# Patient Record
Sex: Male | Born: 1955 | Race: White | Hispanic: No | Marital: Single | Smoking: Never smoker
Health system: Southern US, Community
[De-identification: ages and names within clinical notes are randomized; demographics above are authoritative.]

## PROBLEM LIST (undated history)

## (undated) DIAGNOSIS — Z8619 Personal history of other infectious and parasitic diseases: Secondary | ICD-10-CM

## (undated) DIAGNOSIS — Z973 Presence of spectacles and contact lenses: Secondary | ICD-10-CM

## (undated) DIAGNOSIS — F32A Depression, unspecified: Secondary | ICD-10-CM

## (undated) HISTORY — PX: RHINOPLASTY: SUR1284

## (undated) HISTORY — DX: Depression, unspecified: F32.A

## (undated) HISTORY — DX: Personal history of other infectious and parasitic diseases: Z86.19

---

## 2007-10-30 DIAGNOSIS — F329 Major depressive disorder, single episode, unspecified: Secondary | ICD-10-CM | POA: Insufficient documentation

## 2007-10-30 DIAGNOSIS — F32A Depression, unspecified: Secondary | ICD-10-CM | POA: Insufficient documentation

## 2008-05-28 DIAGNOSIS — R5381 Other malaise: Secondary | ICD-10-CM | POA: Insufficient documentation

## 2015-10-26 ENCOUNTER — Ambulatory Visit (INDEPENDENT_AMBULATORY_CARE_PROVIDER_SITE_OTHER): Payer: No Typology Code available for payment source | Admitting: Family Medicine

## 2015-10-26 ENCOUNTER — Encounter: Payer: Self-pay | Admitting: Family Medicine

## 2015-10-26 VITALS — BP 98/64 | HR 85 | Temp 98.3°F | Resp 16 | Ht 71.5 in | Wt 195.0 lb

## 2015-10-26 DIAGNOSIS — K921 Melena: Secondary | ICD-10-CM | POA: Insufficient documentation

## 2015-10-26 DIAGNOSIS — G479 Sleep disorder, unspecified: Secondary | ICD-10-CM | POA: Insufficient documentation

## 2015-10-26 DIAGNOSIS — F419 Anxiety disorder, unspecified: Secondary | ICD-10-CM

## 2015-10-26 DIAGNOSIS — Z23 Encounter for immunization: Secondary | ICD-10-CM

## 2015-10-26 DIAGNOSIS — Z0000 Encounter for general adult medical examination without abnormal findings: Secondary | ICD-10-CM

## 2015-10-26 DIAGNOSIS — S161XXA Strain of muscle, fascia and tendon at neck level, initial encounter: Secondary | ICD-10-CM | POA: Insufficient documentation

## 2015-10-26 DIAGNOSIS — Z566 Other physical and mental strain related to work: Secondary | ICD-10-CM | POA: Insufficient documentation

## 2015-10-26 MED ORDER — ALPRAZOLAM 0.5 MG PO TABS: 0.5000 mg | ORAL_TABLET | Freq: Every day | ORAL | Status: DC | PRN

## 2015-10-26 MED ORDER — SERTRALINE HCL 100 MG PO TABS: 100.0000 mg | ORAL_TABLET | Freq: Every day | ORAL | Status: DC

## 2015-10-26 NOTE — Progress Notes (Signed)
Patient: Eddie Cabrera, Male    DOB: 01-05-1956, 59 y.o.   MRN: [id number] Visit Date: 10/26/2015  Today's Provider: [name], MD   Chief Complaint  Patient presents with  . Annual Exam  . Anxiety  . Depression   Subjective:    Annual physical exam Eddie Cabrera is a 59 y.o. male who presents today for health maintenance and complete physical. He feels well. He reports exercising yes. He reports he is sleeping fairly well.  -----------------------------------------------------------------   Follow-up for anxiety from 11/26/14; restarted alprazolam prn. States he is doing really well on sertraline and wants to continue same regimen.     Review of Systems  Constitutional: Negative.   HENT: Negative.   Eyes: Negative.   Respiratory: Negative.   Cardiovascular: Negative.   Gastrointestinal: Positive for diarrhea. Negative for blood in stool.       X2 years  Endocrine: Negative.   Genitourinary: Positive for decreased urine volume.  Musculoskeletal: Positive for myalgias, back pain, arthralgias, neck pain and neck stiffness.  Skin: Negative.   Allergic/Immunologic: Negative.   Neurological: Negative.   Hematological: Negative.   Psychiatric/Behavioral: Negative.     Social History He  reports that he has never smoked. He does not have any smokeless tobacco history on file. He reports that he drinks alcohol. He reports that he does not use illicit drugs. Social History   Social History  . Marital Status: Single    Spouse Name: N/A  . Number of Children: 1  . Years of Education: N/A   Social History Main Topics  . Smoking status: Never Smoker   . Smokeless tobacco: None  . Alcohol Use: 0.0 oz/week    0 Standard drinks or equivalent per week     Comment: occasional  . Drug Use: No  . Sexual Activity: Not Asked   Other Topics Concern  . None   Social History Narrative    Patient Active Problem List   Diagnosis Date Noted  . Anxiety 10/26/2015    . Blood in feces 10/26/2015  . Cervical muscle strain 10/26/2015  . Sleep disturbance 10/26/2015  . Stress at work 10/26/2015  . Depressive disorder 10/30/2007  . Episodic mood disorder ([place]) 12/26/1998    Past Surgical History  Procedure Laterality Date  . Rhinoplasty      AGE 55 and again at age 23- history of deviated Septum    Family History  Family Status  Relation Status Death Age  . Mother Deceased     died in her 79's  . Father Deceased 15    kidney failure  . Sister Alive    His family history includes Bladder Cancer in his mother; Kidney failure in his father.    No Known Allergies  Previous Medications   ALPRAZOLAM (XANAX) 0.5 MG TABLET    Take 1-2 tablets by mouth daily as needed.   SERTRALINE (ZOLOFT) 100 MG TABLET    Take 1-2 tablets by mouth daily.    Patient Care Team: Birdie Sons, MD as PCP - General (Family Medicine)     Objective:   Vitals: BP 98/64 mmHg  Pulse 85  Temp(Src) 98.3 F (36.8 C) (Oral)  Resp 16  Ht 5' 11.5" (1.816 m)  Wt 195 lb (88.451 kg)  BMI 26.82 kg/m2  SpO2 97%   Physical Exam   General Appearance:    Alert, cooperative, no distress, appears stated age  Head:    Normocephalic, without obvious  abnormality, atraumatic  Eyes:    PERRL, conjunctiva/corneas clear, EOM's intact, fundi    benign, both eyes       Ears:    Normal TM's and external ear canals, both ears  Nose:   Nares normal, septum midline, mucosa normal, no drainage   or sinus tenderness  Throat:   Lips, mucosa, and tongue normal; teeth and gums normal  Neck:   Supple, symmetrical, trachea midline, no adenopathy;       thyroid:  No enlargement/tenderness/nodules; no carotid   bruit or JVD  Back:     Symmetric, no curvature, ROM normal, no CVA tenderness  Lungs:     Clear to auscultation bilaterally, respirations unlabored  Chest wall:    No tenderness or deformity  Heart:    Regular rate and rhythm, S1 and S2 normal, no murmur, rub   or gallop   Abdomen:     Soft, non-tender, bowel sounds active all four quadrants,    no masses, no organomegaly  Genitalia:    deferred  Rectal:    deferred  Extremities:   Extremities normal, atraumatic, no cyanosis or edema  Pulses:   2+ and symmetric all extremities  Skin:   Skin color, texture, turgor normal, no rashes or lesions  Lymph nodes:   Cervical, supraclavicular, and axillary nodes normal  Neurologic:   CNII-XII intact. Normal strength, sensation and reflexes      throughout     Depression Screen No flowsheet data found.    Assessment & Plan:     Routine Health Maintenance and Physical Exam  Exercise Activities and Dietary recommendations Goals    None      Immunization History  Administered Date(s) Administered  . Tdap 05/28/2008    Health Maintenance  Topic Date Due  . Hepatitis C Screening  10-31-1956  . HIV Screening  12/19/1973  . TETANUS/TDAP  12/19/1977  . COLONOSCOPY  12/19/2008  . INFLUENZA VACCINE  07/27/2015      Discussed health benefits of physical activity, and encouraged him to engage in regular exercise appropriate for his age and condition.    --------------------------------------------------------------------  1. Annual physical exam  - Comprehensive metabolic panel - Lipid panel - PSA - Ambulatory referral to Gastroenterology  2. Anxiety Doing very well on current medications.  - ALPRAZolam (XANAX) 0.5 MG tablet; Take 1-2 tablets (0.5-1 mg total) by mouth daily as needed.  Dispense: 30 tablet; Refill: 1 - sertraline (ZOLOFT) 100 MG tablet; Take 1-2 tablets (100-200 mg total) by mouth daily.  Dispense: 30 tablet; Refill: 12  3. Need for influenza vaccination  - Flu Vaccine QUAD 36+ mos IM

## 2015-10-27 ENCOUNTER — Encounter: Payer: Self-pay | Admitting: Family Medicine

## 2015-10-30 ENCOUNTER — Other Ambulatory Visit: Payer: Self-pay

## 2015-10-30 ENCOUNTER — Telehealth: Payer: Self-pay | Admitting: Gastroenterology

## 2015-10-30 NOTE — Telephone Encounter (Signed)
Gastroenterology Pre-Procedure Review  Request Date: 11-09-2015 Requesting Physician: Dr.   PATIENT REVIEW QUESTIONS: The patient responded to the following health history questions as indicated:    1. Are you having any GI issues? no 2. Do you have a personal history of Polyps? no 3. Do you have a family history of Colon Cancer or Polyps? no 4. Diabetes Mellitus? no 5. Joint replacements in the past 12 months?no 6. Major health problems in the past 3 months?no 7. Any artificial heart valves, MVP, or defibrillator?no    MEDICATIONS & ALLERGIES:    Patient reports the following regarding taking any anticoagulation/antiplatelet therapy:   Plavix, Coumadin, Eliquis, Xarelto, Lovenox, Pradaxa, Brilinta, or Effient? no Aspirin? no  Patient confirms/reports the following medications:  Current Outpatient Prescriptions  Medication Sig Dispense Refill   ALPRAZolam (XANAX) 0.5 MG tablet Take 1-2 tablets (0.5-1 mg total) by mouth daily as needed. 30 tablet 1   sertraline (ZOLOFT) 100 MG tablet Take 1-2 tablets (100-200 mg total) by mouth daily. 30 tablet 12   No current facility-administered medications for this visit.    Patient confirms/reports the following allergies:  No Known Allergies  No orders of the defined types were placed in this encounter.    AUTHORIZATION INFORMATION Primary Insurance: 1D#: Group #:  Secondary Insurance: 1D#: Group #:  SCHEDULE INFORMATION: Date: 11-09-2015 Time: Location:[facility]

## 2015-11-04 ENCOUNTER — Encounter: Payer: Self-pay | Admitting: *Deleted

## 2015-11-04 ENCOUNTER — Encounter: Payer: Self-pay | Admitting: Anesthesiology

## 2015-11-04 ENCOUNTER — Telehealth: Payer: Self-pay | Admitting: Gastroenterology

## 2015-11-04 NOTE — Telephone Encounter (Signed)
Patient has a colonoscopy scheduled but doesn't have a ride. May need to cancel OR reschedule. Please call.

## 2015-11-04 NOTE — Telephone Encounter (Signed)
Pt had to cancel for now due to not having anyone to bring him.

## 2015-11-05 NOTE — Discharge Instructions (Signed)

## 2015-11-09 ENCOUNTER — Ambulatory Visit
Admission: RE | Admit: 2015-11-09 | Payer: No Typology Code available for payment source | Source: Ambulatory Visit | Admitting: Gastroenterology

## 2015-11-09 HISTORY — DX: Presence of spectacles and contact lenses: Z97.3

## 2015-11-09 SURGERY — COLONOSCOPY WITH PROPOFOL
Anesthesia: Choice

## 2015-11-13 ENCOUNTER — Other Ambulatory Visit: Payer: Self-pay | Admitting: Family Medicine

## 2016-02-22 ENCOUNTER — Other Ambulatory Visit: Payer: Self-pay | Admitting: Family Medicine

## 2016-02-22 ENCOUNTER — Other Ambulatory Visit: Payer: Self-pay | Admitting: *Deleted

## 2016-02-22 MED ORDER — SERTRALINE HCL 100 MG PO TABS: 100.0000 mg | ORAL_TABLET | Freq: Every day | ORAL | Status: DC

## 2016-02-22 NOTE — Telephone Encounter (Signed)
Requesting 90 day supply.

## 2016-09-14 ENCOUNTER — Encounter: Payer: Self-pay | Admitting: Family Medicine

## 2016-09-14 ENCOUNTER — Ambulatory Visit (INDEPENDENT_AMBULATORY_CARE_PROVIDER_SITE_OTHER): Payer: BLUE CROSS/BLUE SHIELD | Admitting: Family Medicine

## 2016-09-14 VITALS — BP 124/88 | HR 68 | Temp 98.0°F | Resp 16 | Wt 197.0 lb

## 2016-09-14 DIAGNOSIS — Z23 Encounter for immunization: Secondary | ICD-10-CM

## 2016-09-14 DIAGNOSIS — M25541 Pain in joints of right hand: Secondary | ICD-10-CM

## 2016-09-14 NOTE — Progress Notes (Signed)
       Patient: Eddie Cabrera Male    DOB: August 20, 1956   60 y.o.   MRN: [id number] Visit Date: 09/14/2016  Today's Provider: [name], MD   Chief Complaint  Patient presents with  . Hand Pain   Subjective:    Hand Pain   Incident onset: 1 week ago. There was no injury mechanism. Pain location: joints of fingers in both hands. The quality of the pain is described as aching. The pain has been constant since the incident. Pertinent negatives include no chest pain, muscle weakness, numbness or tingling. He has tried NSAIDs for the symptoms. The treatment provided mild relief.  No recent injuries and no new physical activities. Tried ibuprofen this morning which helped a little bit. No fevers chills, or sweats. No locking of knuckles. He states his father had bad arthritis and he is concerned about RA.      No Known Allergies   Current Outpatient Prescriptions:  .  ALPRAZolam (XANAX) 0.5 MG tablet, Take 1-2 tablets (0.5-1 mg total) by mouth daily as needed., Disp: 30 tablet, Rfl: 1 .  Multiple Vitamin (MULTIVITAMIN) tablet, Take 1 tablet by mouth daily., Disp: , Rfl:  .  sertraline (ZOLOFT) 100 MG tablet, Take 1-2 tablets (100-200 mg total) by mouth daily., Disp: 180 tablet, Rfl: 1  Review of Systems  Constitutional: Positive for fatigue. Negative for appetite change, chills and fever.  Respiratory: Negative for chest tightness, shortness of breath and wheezing.   Cardiovascular: Negative for chest pain and palpitations.  Gastrointestinal: Negative for abdominal pain, nausea and vomiting.  Musculoskeletal: Positive for arthralgias (joints in fingers).  Neurological: Negative for tingling and numbness.    Social History  Substance Use Topics  . Smoking status: Never Smoker  . Smokeless tobacco: Never Used  . Alcohol use 4.2 oz/week    7 Cans of beer per week     Comment: moderate use   Objective:   BP 124/88 (BP Location: Left Arm, Patient Position: Sitting, Cuff Size:  Large)   Pulse 68   Temp 98 F (36.7 C) (Oral)   Resp 16   Wt 197 lb (89.4 kg)   BMI 27.09 kg/m   Physical Exam  General appearance: alert, well developed, well nourished, cooperative and in no distress Head: Normocephalic, without obvious abnormality, atraumatic Respiratory: Respirations even and unlabored, normal respiratory rate Extremities: Slight tenderness of IPs of third fourth fingers of both hands. Slightly swollen, no erythema.      Assessment & Plan:     1. Arthralgia of hand, right  - DG Hand Complete Right; Future - ANA w/Reflex if Positive - Rheumatoid factor - Sedimentation rate - CYCLIC CITRUL PEPTIDE ANTIBODY, IGG/IGA - Uric acid  2. Need for influenza vaccination  - Flu Vaccine QUAD 36+ mos PF IM (Fluarix & Fluzone Quad PF)     The entirety of the information documented in the History of Present Illness, Review of Systems and Physical Exam were personally obtained by me. Portions of this information were initially documented by Meyer Cory, CMA and reviewed by me for thoroughness and accuracy.    [name], MD  [facility]

## 2016-09-14 NOTE — Patient Instructions (Signed)
Go to the [facility] on [address] for hand [employer]

## 2016-10-03 ENCOUNTER — Other Ambulatory Visit: Payer: Self-pay | Admitting: Family Medicine

## 2016-10-21 ENCOUNTER — Ambulatory Visit
Admission: RE | Admit: 2016-10-21 | Discharge: 2016-10-21 | Disposition: A | Discharge status: Home / Self Care | Payer: BLUE CROSS/BLUE SHIELD | Source: Ambulatory Visit | Attending: Family Medicine | Admitting: Family Medicine

## 2016-10-21 DIAGNOSIS — M25541 Pain in joints of right hand: Secondary | ICD-10-CM | POA: Diagnosis not present

## 2016-10-21 DIAGNOSIS — M79641 Pain in right hand: Secondary | ICD-10-CM | POA: Diagnosis not present

## 2016-10-23 LAB — ANA W/REFLEX IF POSITIVE
Anti Nuclear Antibody(ANA): POSITIVE — AB
DSDNA AB: 1 [IU]/mL (ref 0–9)
ENA RNP Ab: 1.4 AI — ABNORMAL HIGH (ref 0.0–0.9)
ENA SM Ab Ser-aCnc: <0.2 AI (ref 0.0–0.9)
ENA SSA (RO) Ab: <0.2 AI (ref 0.0–0.9)
ENA SSB (LA) Ab: <0.2 AI (ref 0.0–0.9)

## 2016-10-23 LAB — SEDIMENTATION RATE: SED RATE: 2 mm/h (ref 0–30)

## 2016-10-23 LAB — RHEUMATOID FACTOR

## 2016-10-23 LAB — URIC ACID: URIC ACID: 5.4 mg/dL (ref 3.7–8.6)

## 2016-10-23 LAB — CYCLIC CITRUL PEPTIDE ANTIBODY, IGG/IGA: CYCLIC CITRULLIN PEPTIDE AB: 17 U (ref 0–19)

## 2016-10-27 ENCOUNTER — Ambulatory Visit (INDEPENDENT_AMBULATORY_CARE_PROVIDER_SITE_OTHER): Payer: BLUE CROSS/BLUE SHIELD | Admitting: Family Medicine

## 2016-10-27 ENCOUNTER — Encounter: Payer: Self-pay | Admitting: Family Medicine

## 2016-10-27 VITALS — BP 130/88 | HR 76 | Temp 97.9°F | Resp 16 | Ht 71.5 in | Wt 196.0 lb

## 2016-10-27 DIAGNOSIS — F419 Anxiety disorder, unspecified: Secondary | ICD-10-CM | POA: Diagnosis not present

## 2016-10-27 DIAGNOSIS — Z125 Encounter for screening for malignant neoplasm of prostate: Secondary | ICD-10-CM | POA: Diagnosis not present

## 2016-10-27 DIAGNOSIS — Z0000 Encounter for general adult medical examination without abnormal findings: Secondary | ICD-10-CM

## 2016-10-27 MED ORDER — ALPRAZOLAM 0.5 MG PO TABS: 0.5000 mg | ORAL_TABLET | Freq: Every day | ORAL | 1 refills | Status: DC | PRN

## 2016-10-27 NOTE — Progress Notes (Signed)
Patient: Eddie Cabrera, Male    DOB: [birth year], 58 y.o.   MRN: [id number] Visit Date: 10/27/2016  Today's Provider: [name], MD   Chief Complaint  Patient presents with  . Annual Exam  . Depression  . Anxiety   Subjective:    Annual physical exam Eddie Cabrera is a 60 y.o. male who presents today for health maintenance and complete physical. He feels well. He reports exercising yes/some. He reports he is sleeping well.  ----------------------------------------------------------------   He reports that sertraline has been very helpful to his mood. Does not perseverate or get anxious about minor events and stresses in life. Mood is generally very good. Usually sleeps well. No adverse effects. Takes alprazolam occasionally only to help fall asleep.    Review of Systems  Constitutional: Negative.   HENT: Positive for postnasal drip.   Eyes: Negative.   Respiratory: Positive for apnea.   Cardiovascular: Negative.   Gastrointestinal: Negative.   Endocrine: Negative.   Genitourinary: Negative.   Musculoskeletal: Positive for neck stiffness.  Skin: Negative.   Allergic/Immunologic: Negative.   Neurological: Negative.   Hematological: Negative.   Psychiatric/Behavioral: Negative.     Social History      He  reports that he has never smoked. He has never used smokeless tobacco. He reports that he drinks about 4.2 oz of alcohol per week . He reports that he does not use drugs.       Social History   Social History  . Marital status: Single    Spouse name: N/A  . Number of children: 1  . Years of education: N/A   Social History Main Topics  . Smoking status: Never Smoker  . Smokeless tobacco: Never Used  . Alcohol use 4.2 oz/week    7 Cans of beer per week     Comment: moderate use  . Drug use: No  . Sexual activity: Not Asked   Other Topics Concern  . None   Social History Narrative  . None    Past Medical History:  Diagnosis Date  . History  of chicken pox   . Wears contact lenses      Patient Active Problem List   Diagnosis Date Noted  . Anxiety 10/26/2015  . Blood in feces 10/26/2015  . Cervical muscle strain 10/26/2015  . Sleep disturbance 10/26/2015  . Stress at work 10/26/2015  . Depressive disorder 10/30/2007  . Episodic mood disorder ([place]) 12/26/1998    Past Surgical History:  Procedure Laterality Date  . RHINOPLASTY     AGE 28 and again at age 38- history of deviated Septum    Family History        Family Status  Relation Status  . Mother Deceased   died in her 60's  . Father Deceased at age 33   kidney failure  . Sister Alive        His family history includes Bladder Cancer in his mother; Kidney failure in his father.    No Known Allergies  Current Meds  Medication Sig  . ALPRAZolam (XANAX) 0.5 MG tablet Take 1-2 tablets (0.5-1 mg total) by mouth daily as needed.  . Multiple Vitamin (MULTIVITAMIN) tablet Take 1 tablet by mouth daily.  . sertraline (ZOLOFT) 100 MG tablet TAKE 1-2 TABLETS (100-200 MG TOTAL) BY MOUTH DAILY.    Patient Care Team: Birdie Sons, MD as PCP - General (Family Medicine)     Objective:   Vitals: BP 130/88 (  BP Location: Left Arm, Patient Position: Sitting, Cuff Size: Large)   Pulse 76   Temp 97.9 F (36.6 C) (Oral)   Resp 16   Ht 5' 11.5" (1.816 m)   Wt 196 lb (88.9 kg)   SpO2 96%   BMI 26.96 kg/m    Physical Exam   General Appearance:    Alert, cooperative, no distress, appears stated age  Head:    Normocephalic, without obvious abnormality, atraumatic  Eyes:    PERRL, conjunctiva/corneas clear, EOM's intact, fundi    benign, both eyes       Ears:    Normal TM's and external ear canals, both ears  Nose:   Nares normal, septum midline, mucosa normal, no drainage   or sinus tenderness  Throat:   Lips, mucosa, and tongue normal; teeth and gums normal  Neck:   Supple, symmetrical, trachea midline, no adenopathy;       thyroid:  No  enlargement/tenderness/nodules; no carotid   bruit or JVD  Back:     Symmetric, no curvature, ROM normal, no CVA tenderness  Lungs:     Clear to auscultation bilaterally, respirations unlabored  Chest wall:    No tenderness or deformity  Heart:    Regular rate and rhythm, S1 and S2 normal, no murmur, rub   or gallop  Abdomen:     Soft, non-tender, bowel sounds active all four quadrants,    no masses, no organomegaly  Genitalia:    deferred  Rectal:    deferred  Extremities:   Extremities normal, atraumatic, no cyanosis or edema  Pulses:   2+ and symmetric all extremities  Skin:   Skin color, texture, turgor normal, no rashes or lesions  Lymph nodes:   Cervical, supraclavicular, and axillary nodes normal  Neurologic:   CNII-XII intact. Normal strength, sensation and reflexes      throughout    Depression Screen No flowsheet data found.    Assessment & Plan:     Routine Health Maintenance and Physical Exam  Exercise Activities and Dietary recommendations Goals    None      Immunization History  Administered Date(s) Administered  . Influenza,inj,Quad PF,36+ Mos 10/26/2015, 09/14/2016  . Tdap 05/28/2008    Health Maintenance  Topic Date Due  . Hepatitis C Screening  October 21, 1956  . HIV Screening  12/19/1973  . COLONOSCOPY  12/19/2008  . TETANUS/TDAP  05/28/2018  . INFLUENZA VACCINE  Completed      Discussed health benefits of physical activity, and encouraged him to engage in regular exercise appropriate for his age and condition.    --------------------------------------------------------------------  1. Annual physical exam  - Comprehensive metabolic panel - Lipid panel  2. Prostate cancer screening  - PSA  3. Anxiety Doing very well with sertraline and occasional alprazolam.  - ALPRAZolam (XANAX) 0.5 MG tablet; Take 1-2 tablets (0.5-1 mg total) by mouth daily as needed.  Dispense: 30 tablet; Refill: 1     [name], MD  [facility]

## 2016-10-28 LAB — COMPREHENSIVE METABOLIC PANEL
A/G RATIO: 1.6 (ref 1.2–2.2)
ALT: 17 IU/L (ref 0–44)
AST: 20 IU/L (ref 0–40)
Albumin: 4.6 g/dL (ref 3.5–5.5)
Alkaline Phosphatase: 62 IU/L (ref 39–117)
BILIRUBIN TOTAL: 0.5 mg/dL (ref 0.0–1.2)
BUN/Creatinine Ratio: 20 (ref 9–20)
BUN: 19 mg/dL (ref 6–24)
CALCIUM: 9.9 mg/dL (ref 8.7–10.2)
CO2: 24 mmol/L (ref 18–29)
Chloride: 99 mmol/L (ref 96–106)
Creatinine, Ser: 0.93 mg/dL (ref 0.76–1.27)
GFR, EST AFRICAN AMERICAN: 105 mL/min/{1.73_m2} (ref >59)
GFR, EST NON AFRICAN AMERICAN: 91 mL/min/{1.73_m2} (ref >59)
GLOBULIN, TOTAL: 2.8 g/dL (ref 1.5–4.5)
Glucose: 90 mg/dL (ref 65–99)
POTASSIUM: 5 mmol/L (ref 3.5–5.2)
SODIUM: 139 mmol/L (ref 134–144)
Total Protein: 7.4 g/dL (ref 6.0–8.5)

## 2016-10-28 LAB — LIPID PANEL
Chol/HDL Ratio: 4.7 ratio units (ref 0.0–5.0)
Cholesterol, Total: 201 mg/dL — ABNORMAL HIGH (ref 100–199)
HDL: 43 mg/dL (ref >39)
LDL CALC: 119 mg/dL — AB (ref 0–99)
TRIGLYCERIDES: 193 mg/dL — AB (ref 0–149)
VLDL CHOLESTEROL CAL: 39 mg/dL (ref 5–40)

## 2016-10-28 LAB — PSA: Prostate Specific Ag, Serum: 4.1 ng/mL — ABNORMAL HIGH (ref 0.0–4.0)

## 2017-05-22 ENCOUNTER — Other Ambulatory Visit: Payer: Self-pay | Admitting: Family Medicine

## 2017-05-22 DIAGNOSIS — F419 Anxiety disorder, unspecified: Secondary | ICD-10-CM

## 2017-05-23 NOTE — Telephone Encounter (Signed)
Please call in alprazolam.  

## 2017-05-23 NOTE — Telephone Encounter (Signed)
Rx called in to pharmacy. 

## 2017-07-05 ENCOUNTER — Telehealth: Payer: Self-pay | Admitting: Family Medicine

## 2017-07-05 NOTE — Telephone Encounter (Signed)
Arbie Cookey,  Dr Caryn Section sent this to the pool instead of you. Thanks ED

## 2017-07-05 NOTE — Telephone Encounter (Signed)
Pt emailed me regarding his labs that were ordered on 10/11/16.  He was asking if charges were appropriate and if all the test were necessary.   Test ordered were: Rheumatoid Arthritis Factor CCP Antibodies IgG/IgA Uric Acid ANA w. Reflex ENA+DNA/DS+Antich+Centro+jo Sed Rate

## 2017-07-05 NOTE — Telephone Encounter (Signed)
These were appropriate tests for rheumatoid arthritis and were necessary to rule out that disease.

## 2017-07-06 NOTE — Telephone Encounter (Signed)
Replied to patient's email and told him the test were appropriate as per Dr. Maralyn Sago note.

## 2017-10-03 ENCOUNTER — Other Ambulatory Visit: Payer: Self-pay | Admitting: Family Medicine

## 2017-11-23 ENCOUNTER — Other Ambulatory Visit: Payer: Self-pay

## 2017-11-23 ENCOUNTER — Ambulatory Visit (INDEPENDENT_AMBULATORY_CARE_PROVIDER_SITE_OTHER): Payer: BLUE CROSS/BLUE SHIELD | Admitting: Family Medicine

## 2017-11-23 ENCOUNTER — Encounter: Payer: Self-pay | Admitting: Family Medicine

## 2017-11-23 VITALS — BP 98/70 | HR 75 | Temp 97.7°F | Resp 16 | Ht 72.0 in | Wt 192.0 lb

## 2017-11-23 DIAGNOSIS — Z1211 Encounter for screening for malignant neoplasm of colon: Secondary | ICD-10-CM | POA: Diagnosis not present

## 2017-11-23 DIAGNOSIS — Z1159 Encounter for screening for other viral diseases: Secondary | ICD-10-CM | POA: Diagnosis not present

## 2017-11-23 DIAGNOSIS — F39 Unspecified mood [affective] disorder: Secondary | ICD-10-CM | POA: Diagnosis not present

## 2017-11-23 DIAGNOSIS — Z0000 Encounter for general adult medical examination without abnormal findings: Secondary | ICD-10-CM

## 2017-11-23 DIAGNOSIS — Z125 Encounter for screening for malignant neoplasm of prostate: Secondary | ICD-10-CM | POA: Diagnosis not present

## 2017-11-23 DIAGNOSIS — Z23 Encounter for immunization: Secondary | ICD-10-CM

## 2017-11-23 DIAGNOSIS — F419 Anxiety disorder, unspecified: Secondary | ICD-10-CM

## 2017-11-23 MED ORDER — SERTRALINE HCL 100 MG PO TABS: 100.0000 mg | ORAL_TABLET | Freq: Every day | ORAL | 2 refills | Status: DC

## 2017-11-23 MED ORDER — ALPRAZOLAM 0.5 MG PO TABS: 0.2500 mg | ORAL_TABLET | Freq: Every day | ORAL | 2 refills | Status: DC | PRN

## 2017-11-23 NOTE — Progress Notes (Signed)
c      Patient: Eddie Cabrera, Male    DOB: [birth year], 61 y.o.   MRN: [id number] Visit Date: 11/23/2017  Today's Provider: [name], MD   Chief Complaint  Patient presents with  . Annual Exam   Subjective:    Annual physical exam Eddie Cabrera is a 61 y.o. male who presents today for health maintenance and complete physical. He feels well. He reports exercising some. He reports he is sleeping fairly well.  ----------------------------------------------------------------  Anxiety From 10/27/2016-Doing very well with sertraline and occasional alprazolam. Feels that sertraline is still helping, especially by keeping him from ruminating. He wishes to continue same dose, he usually only takes one a day and rarely takes alprazolam.    Review of Systems  Constitutional: Positive for fatigue.  HENT: Negative.   Eyes: Negative.   Respiratory: Negative.   Cardiovascular: Negative.   Gastrointestinal: Negative.   Endocrine: Negative.   Genitourinary: Negative.   Musculoskeletal: Positive for arthralgias, back pain, neck pain and neck stiffness.  Skin: Negative.   Allergic/Immunologic: Negative.   Neurological: Positive for headaches.  Hematological: Negative.   Psychiatric/Behavioral: Negative.     Social History      He  reports that  has never smoked. he has never used smokeless tobacco. He reports that he drinks about 4.2 oz of alcohol per week. He reports that he does not use drugs.       Social History   Socioeconomic History  . Marital status: Single    Spouse name: None  . Number of children: 1  . Years of education: None  . Highest education level: None  Social Needs  . Financial resource strain: None  . Food insecurity - worry: None  . Food insecurity - inability: None  . Transportation needs - medical: None  . Transportation needs - non-medical: None  Occupational History  . None  Tobacco Use  . Smoking status: Never Smoker  . Smokeless tobacco:  Never Used  Substance and Sexual Activity  . Alcohol use: Yes    Alcohol/week: 4.2 oz    Types: 7 Cans of beer per week    Comment: moderate use  . Drug use: No  . Sexual activity: None  Other Topics Concern  . None  Social History Narrative  . None    Past Medical History:  Diagnosis Date  . History of chicken pox   . Wears contact lenses      Patient Active Problem List   Diagnosis Date Noted  . Anxiety 10/26/2015  . Blood in feces 10/26/2015  . Cervical muscle strain 10/26/2015  . Sleep disturbance 10/26/2015  . Stress at work 10/26/2015  . Depressive disorder 10/30/2007  . Episodic mood disorder ([place]) 12/26/1998    Past Surgical History:  Procedure Laterality Date  . RHINOPLASTY     AGE 52 and again at age 26- history of deviated Septum    Family History        Family Status  Relation Name Status  . Mother  Deceased       died in her 47's  . Father  Deceased at age 17       kidney failure  . Sister  Alive  . Neg Hx  (Not Specified)        His family history includes Bladder Cancer in his mother; Kidney failure in his father.     No Known Allergies   Current Outpatient Medications:  .  ALPRAZolam (XANAX) 0.5 MG  tablet, TAKE 1 TO 2 TABLETS BY MOUTH EVERY DAY AS NEEDED, Disp: 30 tablet, Rfl: 5 .  Multiple Vitamin (MULTIVITAMIN) tablet, Take 1 tablet by mouth daily., Disp: , Rfl:  .  sertraline (ZOLOFT) 100 MG tablet, TAKE 1-2 TABLETS (100-200 MG TOTAL) BY MOUTH DAILY., Disp: 180 tablet, Rfl: 0   Patient Care Team: Birdie Sons, MD as PCP - General (Family Medicine)      Objective:   Vitals: BP 98/70 (BP Location: Right Arm, Patient Position: Sitting, Cuff Size: Normal)   Pulse 75   Temp 97.7 F (36.5 C) (Oral)   Resp 16   Ht 6' (1.829 m)   Wt 192 lb (87.1 kg)   SpO2 96%   BMI 26.04 kg/m    Vitals:   11/23/17 1014  BP: 98/70  Pulse: 75  Resp: 16  Temp: 97.7 F (36.5 C)  TempSrc: Oral  SpO2: 96%  Weight: 192 lb (87.1 kg)    Height: 6' (1.829 m)     Physical Exam   General Appearance:    Alert, cooperative, no distress, appears stated age  Head:    Normocephalic, without obvious abnormality, atraumatic  Eyes:    PERRL, conjunctiva/corneas clear, EOM's intact, fundi    benign, both eyes       Ears:    Normal TM's and external ear canals, both ears  Nose:   Nares normal, septum midline, mucosa normal, no drainage   or sinus tenderness  Throat:   Lips, mucosa, and tongue normal; teeth and gums normal  Neck:   Supple, symmetrical, trachea midline, no adenopathy;       thyroid:  No enlargement/tenderness/nodules; no carotid   bruit or JVD  Back:     Symmetric, no curvature, ROM normal, no CVA tenderness  Lungs:     Clear to auscultation bilaterally, respirations unlabored  Chest wall:    No tenderness or deformity  Heart:    Regular rate and rhythm, S1 and S2 normal, no murmur, rub   or gallop  Abdomen:     Soft, non-tender, bowel sounds active all four quadrants,    no masses, no organomegaly  Genitalia:    deferred  Rectal:    deferred  Extremities:   Extremities normal, atraumatic, no cyanosis or edema  Pulses:   2+ and symmetric all extremities  Skin:   Skin color, texture, turgor normal, no rashes or lesions  Lymph nodes:   Cervical, supraclavicular, and axillary nodes normal  Neurologic:   CNII-XII intact. Normal strength, sensation and reflexes      throughout    Depression Screen PHQ 2/9 Scores 11/23/2017  PHQ - 2 Score 0  PHQ- 9 Score 0      Assessment & Plan:     Routine Health Maintenance and Physical Exam  Exercise Activities and Dietary recommendations Goals    None      Immunization History  Administered Date(s) Administered  . Influenza,inj,Quad PF,6+ Mos 10/26/2015, 09/14/2016  . Tdap 05/28/2008    Health Maintenance  Topic Date Due  . Hepatitis C Screening  [birth year]  . HIV Screening  12/19/1973  . COLONOSCOPY  12/19/2008  . INFLUENZA VACCINE  07/26/2017  .  TETANUS/TDAP  05/28/2018     Discussed health benefits of physical activity, and encouraged him to engage in regular exercise appropriate for his age and condition.    --------------------------------------------------------------------  1. Annual physical exam  - Cologuard - COMPLETE METABOLIC PANEL WITH GFR - Lipid panel - PSA  2. Episodic mood disorder ([place]) Doing very well with current dose of sertraline  3. Anxiety Rarely requiring alprazolam which is refilled today.  - ALPRAZolam (XANAX) 0.5 MG tablet; Take 0.5-1 tablets (0.25-0.5 mg total) by mouth daily as needed for anxiety or sleep.  Dispense: 30 tablet; Refill: 2  4. Need for hepatitis C screening test - Hepatitis C antibody  5. Need for influenza vaccination  - Flu Vaccine QUAD 36+ mos IM  6. Prostate cancer screening  - PSA  7. Colon cancer screening  - Cologuard   [name], MD  [facility]

## 2017-11-23 NOTE — Patient Instructions (Signed)

## 2017-11-24 LAB — COMPLETE METABOLIC PANEL WITH GFR
AG Ratio: 1.7 (calc) (ref 1.0–2.5)
ALKALINE PHOSPHATASE (APISO): 55 U/L (ref 40–115)
ALT: 16 U/L (ref 9–46)
AST: 17 U/L (ref 10–35)
Albumin: 4.5 g/dL (ref 3.6–5.1)
BILIRUBIN TOTAL: 0.6 mg/dL (ref 0.2–1.2)
BUN: 20 mg/dL (ref 7–25)
CHLORIDE: 103 mmol/L (ref 98–110)
CO2: 27 mmol/L (ref 20–32)
CREATININE: 1.04 mg/dL (ref 0.70–1.33)
Calcium: 9.7 mg/dL (ref 8.6–10.3)
GFR, Est African American: 91 mL/min/{1.73_m2} (ref >=60)
GFR, Est Non African American: 79 mL/min/{1.73_m2} (ref >=60)
GLUCOSE: 91 mg/dL (ref 65–99)
Globulin: 2.7 g/dL (calc) (ref 1.9–3.7)
Potassium: 4.5 mmol/L (ref 3.5–5.3)
SODIUM: 137 mmol/L (ref 135–146)
Total Protein: 7.2 g/dL (ref 6.1–8.1)

## 2017-11-24 LAB — LIPID PANEL
CHOL/HDL RATIO: 5.2 (calc) — AB (ref <5.0)
CHOLESTEROL: 208 mg/dL — AB (ref <200)
HDL: 40 mg/dL — AB (ref >40)
LDL Cholesterol (Calc): 141 mg/dL (calc) — ABNORMAL HIGH
Non-HDL Cholesterol (Calc): 168 mg/dL (calc) — ABNORMAL HIGH (ref <130)
Triglycerides: 143 mg/dL (ref <150)

## 2017-11-24 LAB — HEPATITIS C ANTIBODY
HEP C AB: NONREACTIVE
SIGNAL TO CUT-OFF: 0.11 (ref <1.00)

## 2017-11-24 LAB — PSA: PSA: 3.3 ng/mL (ref <=4.0)

## 2017-11-30 ENCOUNTER — Telehealth: Payer: Self-pay | Admitting: Family Medicine

## 2017-11-30 NOTE — Telephone Encounter (Signed)
Order for cologuard faxed to [facility] °

## 2017-12-12 ENCOUNTER — Ambulatory Visit (INDEPENDENT_AMBULATORY_CARE_PROVIDER_SITE_OTHER): Payer: BLUE CROSS/BLUE SHIELD | Admitting: Family Medicine

## 2017-12-12 DIAGNOSIS — Z23 Encounter for immunization: Secondary | ICD-10-CM

## 2018-02-08 DIAGNOSIS — Z1212 Encounter for screening for malignant neoplasm of rectum: Secondary | ICD-10-CM | POA: Diagnosis not present

## 2018-02-08 DIAGNOSIS — Z1211 Encounter for screening for malignant neoplasm of colon: Secondary | ICD-10-CM | POA: Diagnosis not present

## 2018-02-09 LAB — COLOGUARD: Cologuard: NEGATIVE

## 2018-02-13 ENCOUNTER — Ambulatory Visit (INDEPENDENT_AMBULATORY_CARE_PROVIDER_SITE_OTHER): Payer: BLUE CROSS/BLUE SHIELD | Admitting: Family Medicine

## 2018-02-13 DIAGNOSIS — Z23 Encounter for immunization: Secondary | ICD-10-CM

## 2018-02-13 NOTE — Progress Notes (Signed)
Nurse Visit only.

## 2018-06-04 ENCOUNTER — Encounter: Payer: Self-pay | Admitting: Family Medicine

## 2018-06-04 ENCOUNTER — Ambulatory Visit: Payer: BLUE CROSS/BLUE SHIELD | Admitting: Family Medicine

## 2018-06-04 VITALS — BP 110/84 | HR 76 | Temp 98.3°F | Resp 16 | Wt 194.0 lb

## 2018-06-04 DIAGNOSIS — M720 Palmar fascial fibromatosis [Dupuytren]: Secondary | ICD-10-CM | POA: Diagnosis not present

## 2018-06-04 NOTE — Patient Instructions (Signed)
Dupuytren Contracture Dupuytren contracture is a condition in which tissue under the skin of the palm becomes abnormally thickened. This causes one or more of the fingers to curl inward (contract) toward the palm. Eventually, the fingers may not be able to straighten out. This condition affects some or all of the fingers and the palm of the hand. It is often passed along from parent to child (inherited). Dupuytren contracture is a long-term (chronic) condition that develops (progresses) slowly over time. There is no cure, but symptoms can be managed and progression can be slowed with treatment. This condition is usually not dangerous or painful, but it can interfere with everyday tasks. What are the causes? This condition is caused by tissue (fascia) in the palm getting thicker and tighter. When the fascia thickens, it pulls on the cords of tissue (tendons) that control finger movement. This causes the fingers to contract. The cause of fascia thickening is not known. What increases the risk? This condition may be more likely to develop in:  People who are age 40 or older.  Men.  People with a family history of this condition.  People who use tobacco products, including cigarettes, chewing tobacco, and e-cigarettes.  People who drink alcohol excessively.  People with diabetes.  People with autoimmune diseases, such as HIV.  People with seizure disorders.  What are the signs or symptoms? Symptoms may develop in one or both hands. Any of the fingers can contract. The fingers farthest from the thumb are commonly affected. Usually, this condition is painless. You may have discomfort when holding or grabbing objects. Early symptoms of this condition may include:  Thick, puckered skin on the hand.  One or more lumps (nodules) on the palm. Nodules may be tender when they first appear, but they are generally painless.  Symptoms of this condition develop slowly over months or years. Later  symptoms of this condition may include:  Thick cords of tissue in the palm.  Fingers curled up toward the palm.  Inability to straighten the fingers into their normal position.  How is this diagnosed? This condition is diagnosed with a physical exam, which may include:  Looking at your hands and feeling your hands. This is to check for thickened fascia and nodules.  Measuring finger motion.  Doing the [place] Tabletop Test. You may be asked to try to put your hand on a surface, with your palm down and your fingers straight out.  How is this treated? There is no cure for this condition, but treatment can make symptoms more manageable and relieve discomfort. Treatment options may include:  Physical therapy. This can strengthen your hand and increase flexibility.  Occupational therapy. This can help you with everyday tasks that may be more difficult because of your condition.  A hand splint.  Shots (injections). Substances may be injected into your hand, such as: ? Medicines that help to decrease swelling (corticosteroids). ? Proteins (collagenase) to weaken thick tissue. After a collagenase injection, your health care provider may stretch your fingers.  Needle aponeurotomy. In this procedure, a needle is pushed through the skin and into the fascia. Moving the needle against the fascia can weaken or break up the thick tissue.  Surgery. This may be needed if your condition causes discomfort or interferes with everyday activities. Physical therapy is usually needed after surgery.  In some cases, symptoms never develop to the point of needing major treatment, and caring for yourself at home can be enough to manage your condition. Symptoms often   return after treatment. Follow these instructions at home: If you have a splint:  Do not put pressure on any part of the splint until it is fully hardened. This may take several hours.  Wear the splint as told by your health care provider.  Remove it only as told by your health care provider.  Loosen the splint if your fingers tingle, become numb, or turn cold and blue.  Do not let your splint get wet if it is not waterproof. ? If your splint is not waterproof, cover it with a watertight covering when you take a bath or a shower. ? Do not take baths, swim, or use a hot tub until your health care provider approves. Ask your health care provider if you can take showers. You may only be allowed to take sponge baths for bathing.  Keep the splint clean.  Ask your health care provider when it is safe to drive. Hand Care  Take these actions to help protect your hand from possible injury: ? Use tools that have padded grips. ? Wear protective gloves while you work with your hands. ? Avoid repetitive hand movements.  Avoid actions that cause pain or discomfort.  Stretch your hand by gently pulling your fingers backward toward your wrist. Do this as often as is comfortable. Stop if this causes pain.  Gently massage your hand as often as is comfortable.  If directed, apply heat to the affected area as often as told by your health care provider. Use the heat source that your health care provider recommends, such as a moist heat pack or a heating pad. ? Place a towel between your skin and the heat source. ? Leave the heat on for 20-30 minutes. ? Remove the heat if your skin turns bright red. This is especially important if you are unable to feel pain, heat, or cold. You may have a greater risk of getting burned. General instructions  Take over-the-counter and prescription medicines only as told by your health care provider.  Manage any other conditions that you have, such as diabetes.  If physical therapy was prescribed, do exercises as told by your health care provider.  Keep all follow-up visits as told by your health care provider. This is important. Contact a health care provider if:  You develop new symptoms, or your  symptoms get worse.  You have pain that gets worse or does not get better with medicine.  You have difficulty or discomfort with everyday tasks.  You have problems with your splint.  You develop numbness or tingling. Get help right away if:  You have severe pain.  Your fingers change color or become unusually cold. This information is not intended to replace advice given to you by your health care provider. Make sure you discuss any questions you have with your health care provider. Document Released: 10/09/2009 Document Revised: 01/26/2016 Document Reviewed: 05/06/2015 Elsevier Interactive Patient Education  2018 Elsevier Inc.  

## 2018-06-04 NOTE — Progress Notes (Signed)
       Patient: Eddie Cabrera Male    DOB: 1956-05-22   62 y.o.   MRN: [id number] Visit Date: 06/04/2018  Today's Provider: [name], MD   No chief complaint on file.  Subjective:    HPI  Patient's ring finger on left hand has been locking up for several weeks. Patient states yesterday he noticed a knotted up area in the palm area  Under ringer finger. Patient states there is no pain.   No Known Allergies   Current Outpatient Medications:  .  ALPRAZolam (XANAX) 0.5 MG tablet, Take 0.5-1 tablets (0.25-0.5 mg total) by mouth daily as needed for anxiety or sleep., Disp: 30 tablet, Rfl: 2 .  Multiple Vitamin (MULTIVITAMIN) tablet, Take 1 tablet by mouth daily., Disp: , Rfl:  .  sertraline (ZOLOFT) 100 MG tablet, Take 1-2 tablets (100-200 mg total) by mouth daily., Disp: 180 tablet, Rfl: 2  Review of Systems  Constitutional: Negative for appetite change, chills and fever.  Respiratory: Negative for chest tightness, shortness of breath and wheezing.   Cardiovascular: Negative for chest pain and palpitations.  Gastrointestinal: Negative for abdominal pain, nausea and vomiting.    Social History   Tobacco Use  . Smoking status: Never Smoker  . Smokeless tobacco: Never Used  Substance Use Topics  . Alcohol use: Yes    Alcohol/week: 4.2 oz    Types: 7 Cans of beer per week    Comment: moderate use   Objective:   BP 110/84 (BP Location: Right Arm, Patient Position: Sitting, Cuff Size: Large)   Pulse 76   Temp 98.3 F (36.8 C) (Oral)   Resp 16   Wt 194 lb (88 kg)   SpO2 95%   BMI 26.31 kg/m  Vitals:   06/04/18 1516  BP: 110/84  Pulse: 76  Resp: 16  Temp: 98.3 F (36.8 C)  TempSrc: Oral  SpO2: 95%  Weight: 194 lb (88 kg)     Physical Exam  Duputren's contracture noted over left 4th metacarpal.     Assessment & Plan:     1. Dupuytren's contracture of left hand  - Ambulatory referral to Orthopedic Surgery       [name], MD  [facility]

## 2018-06-08 ENCOUNTER — Telehealth: Payer: Self-pay | Admitting: Family Medicine

## 2018-06-08 NOTE — Telephone Encounter (Signed)
LMTCB. Please ask pt where he would like to be referred and if mornings or afternoons work better. Thanks TNP

## 2018-06-18 NOTE — Telephone Encounter (Signed)
Pt calling back wanting an update on his referral.

## 2018-06-19 NOTE — Telephone Encounter (Signed)
Pt advised that appointment has been scheduled with Dr Jackqulyn Livings 06/26/18 at 8:45

## 2018-06-26 DIAGNOSIS — S63641A Sprain of metacarpophalangeal joint of right thumb, initial encounter: Secondary | ICD-10-CM | POA: Diagnosis not present

## 2018-06-26 DIAGNOSIS — M720 Palmar fascial fibromatosis [Dupuytren]: Secondary | ICD-10-CM | POA: Diagnosis not present

## 2018-11-29 ENCOUNTER — Ambulatory Visit (INDEPENDENT_AMBULATORY_CARE_PROVIDER_SITE_OTHER): Payer: BLUE CROSS/BLUE SHIELD | Admitting: Family Medicine

## 2018-11-29 ENCOUNTER — Encounter: Payer: Self-pay | Admitting: Family Medicine

## 2018-11-29 VITALS — BP 110/72 | HR 68 | Temp 98.1°F | Resp 16 | Ht 72.0 in | Wt 194.0 lb

## 2018-11-29 DIAGNOSIS — Z23 Encounter for immunization: Secondary | ICD-10-CM

## 2018-11-29 DIAGNOSIS — Z0000 Encounter for general adult medical examination without abnormal findings: Secondary | ICD-10-CM

## 2018-11-29 DIAGNOSIS — F39 Unspecified mood [affective] disorder: Secondary | ICD-10-CM

## 2018-11-29 DIAGNOSIS — Z125 Encounter for screening for malignant neoplasm of prostate: Secondary | ICD-10-CM | POA: Diagnosis not present

## 2018-11-29 DIAGNOSIS — F419 Anxiety disorder, unspecified: Secondary | ICD-10-CM | POA: Diagnosis not present

## 2018-11-29 MED ORDER — ALPRAZOLAM 0.5 MG PO TABS
0.2500 mg | ORAL_TABLET | Freq: Every day | ORAL | 2 refills | Status: DC | PRN
Start: 2018-11-29 — End: 2018-11-30

## 2018-11-29 NOTE — Progress Notes (Signed)
Patient: Eddie Cabrera, Male    DOB: 26-May-1956, 62 y.o.   MRN: [id number] Visit Date: 11/29/2018  Today's Provider: [name], MD   No chief complaint on file.  Subjective:    Annual physical exam Eddie Cabrera is a 62 y.o. male who presents today for health maintenance and complete physical. He feels well. He reports exercising about 3-4 days week. He reports he is sleeping fairly well.  Cholesterol was mildly elevated last year, but he reports he is now eating healthy, lots of fruits, still has red meals 4-5 times a week.  Cologuard- 02/08/2018. Negative.  Tdap- 05/28/2008.   Anxiety  Last seen 11/23/2017. Anxiety is well controlled. Rarely using Xanax. Usually just takes to help sleep a couple nights each week. 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 He usually takes one a day, but occasionally increases to 2 a day when he is under more stress than usual.   Review of Systems  Constitutional: Negative for chills, diaphoresis and fever.  HENT: Negative for congestion, ear discharge, ear pain, hearing loss, nosebleeds, sore throat and tinnitus.   Eyes: Negative for photophobia, pain, discharge and redness.  Respiratory: Negative for cough, shortness of breath, wheezing and stridor.   Cardiovascular: Negative for chest pain, palpitations and leg swelling.  Gastrointestinal: Negative for abdominal pain, blood in stool, constipation, diarrhea, nausea and vomiting.  Endocrine: Negative for polydipsia.  Genitourinary: Negative for dysuria, flank pain, frequency, hematuria and urgency.  Musculoskeletal: Negative for back pain, myalgias and neck pain.  Skin: Negative for rash.  Allergic/Immunologic: Negative for environmental allergies.  Neurological: Negative for dizziness, tremors, seizures, weakness and headaches.  Hematological: Does not bruise/bleed easily.  Psychiatric/Behavioral: Negative for hallucinations and suicidal ideas.  The patient is not nervous/anxious.     Social History      He  reports that he has never smoked. He has never used smokeless tobacco. He reports that he drinks about 7.0 standard drinks of alcohol per week. He reports that he does not use drugs.  Usually 1 or 2 glasses of wine a few nights each week.  Social History   Socioeconomic History  . Marital status: Single    Spouse name: Not on file  . Number of children: 1  . Years of education: Not on file  . Highest education level: Not on file  Occupational History  . Not on file  Social Needs  . Financial resource strain: Not on file  . Food insecurity:    Worry: Not on file    Inability: Not on file  . Transportation needs:    Medical: Not on file    Non-medical: Not on file  Tobacco Use  . Smoking status: Never Smoker  . Smokeless tobacco: Never Used  Substance and Sexual Activity  . Alcohol use: Yes    Alcohol/week: 7.0 standard drinks    Types: 7 Cans of beer per week    Comment: moderate use  . Drug use: No  . Sexual activity: Not on file  Lifestyle  . Physical activity:    Days per week: Not on file    Minutes per session: Not on file  . Stress: Not on file  Relationships  . Social connections:    Talks on phone: Not on file    Gets together: Not on file    Attends religious service: Not on file    Active member of club  or organization: Not on file    Attends meetings of clubs or organizations: Not on file    Relationship status: Not on file  Other Topics Concern  . Not on file  Social History Narrative  . Not on file    Past Medical History:  Diagnosis Date  . History of chicken pox   . Wears contact lenses      Patient Active Problem List   Diagnosis Date Noted  . Anxiety 10/26/2015  . Blood in feces 10/26/2015  . Cervical muscle strain 10/26/2015  . Sleep disturbance 10/26/2015  . Stress at work 10/26/2015  . Depressive disorder 10/30/2007  . Episodic mood disorder ([place]) 12/26/1998     Past Surgical History:  Procedure Laterality Date  . RHINOPLASTY     AGE 59 and again at age 81- history of deviated Septum    Family History        Family Status  Relation Name Status  . Mother  Deceased       died in her 7's  . Father  Deceased at age 16       kidney failure  . Sister  Alive  . Neg Hx  (Not Specified)        His family history includes Bladder Cancer in his mother; Kidney failure in his father. There is no history of Colon cancer or Prostate cancer.      No Known Allergies   Current Outpatient Medications:  .  ALPRAZolam (XANAX) 0.5 MG tablet, Take 0.5-1 tablets (0.25-0.5 mg total) by mouth daily as needed for anxiety or sleep., Disp: 30 tablet, Rfl: 2 .  Multiple Vitamin (MULTIVITAMIN) tablet, Take 1 tablet by mouth daily., Disp: , Rfl:  .  sertraline (ZOLOFT) 100 MG tablet, Take 1-2 tablets (100-200 mg total) by mouth daily., Disp: 180 tablet, Rfl: 2   Patient Care Team: Birdie Sons, MD as PCP - General (Family Medicine)      Objective:   Vitals: BP 110/72 (BP Location: Right Arm, Patient Position: Sitting, Cuff Size: Normal)   Pulse 68   Temp 98.1 F (36.7 C)   Resp 16   Ht 6' (1.829 m)   Wt 194 lb (88 kg)   BMI 26.31 kg/m     Physical Exam   General Appearance:    Alert, cooperative, no distress, appears stated age  Head:    Normocephalic, without obvious abnormality, atraumatic  Eyes:    PERRL, conjunctiva/corneas clear, EOM's intact, fundi    benign, both eyes       Ears:    Normal TM's and external ear canals, both ears  Nose:   Nares normal, septum midline, mucosa normal, no drainage   or sinus tenderness  Throat:   Lips, mucosa, and tongue normal; teeth and gums normal  Neck:   Supple, symmetrical, trachea midline, no adenopathy;       thyroid:  No enlargement/tenderness/nodules; no carotid   bruit or JVD  Back:     Symmetric, no curvature, ROM normal, no CVA tenderness  Lungs:     Clear to auscultation bilaterally,  respirations unlabored  Chest wall:    No tenderness or deformity  Heart:    Regular rate and rhythm, S1 and S2 normal, no murmur, rub   or gallop  Abdomen:     Soft, non-tender, bowel sounds active all four quadrants,    no masses, no organomegaly  Genitalia:    deferred  Rectal:    deferred  Extremities:  Extremities normal, atraumatic, no cyanosis or edema  Pulses:   2+ and symmetric all extremities  Skin:   Skin color, texture, turgor normal, no rashes or lesions  Lymph nodes:   Cervical, supraclavicular, and axillary nodes normal  Neurologic:   CNII-XII intact. Normal strength, sensation and reflexes      throughout     Depression Screen PHQ 2/9 Scores 11/23/2017  PHQ - 2 Score 0  PHQ- 9 Score 0      Assessment & Plan:     Routine Health Maintenance and Physical Exam  Exercise Activities and Dietary recommendations Goals   None     Immunization History  Administered Date(s) Administered  . Influenza,inj,Quad PF,6+ Mos 10/26/2015, 09/14/2016, 11/23/2017  . Tdap 05/28/2008  . Zoster Recombinat (Shingrix) 12/12/2017, 02/13/2018    Health Maintenance  Topic Date Due  . HIV Screening  12/19/1973  . TETANUS/TDAP  05/28/2018  . INFLUENZA VACCINE  07/26/2018  . Fecal DNA (Cologuard)  02/08/2021  . Hepatitis C Screening  Completed     Discussed health benefits of physical activity, and encouraged him to engage in regular exercise appropriate for his age and condition.    1. Annual physical exam  - Lipid panel - PSA - Comprehensive metabolic panel  2. Episodic mood disorder (HCC) Doing very well with sertraline. Continue current medications.    3. Prostate cancer screening  - PSA  4. Anxiety Doing well on sertraline and occasionally alprazolam which is refilled today.  - ALPRAZolam (XANAX) 0.5 MG tablet; Take 0.5-1 tablets (0.25-0.5 mg total) by mouth daily as needed for anxiety or sleep.  Dispense: 30 tablet; Refill: 2  5. Need for influenza  vaccination Flu vaccine today  7. Need for prophylactic vaccination using tetanus and diphtheria toxoids adsorbed (Td) vaccine Td today.   [name], MD  [facility]

## 2018-11-30 ENCOUNTER — Telehealth: Payer: Self-pay

## 2018-11-30 DIAGNOSIS — F419 Anxiety disorder, unspecified: Secondary | ICD-10-CM

## 2018-11-30 LAB — COMPREHENSIVE METABOLIC PANEL
ALT: 17 IU/L (ref 0–44)
AST: 16 IU/L (ref 0–40)
Albumin/Globulin Ratio: 2 (ref 1.2–2.2)
Albumin: 4.8 g/dL (ref 3.5–5.5)
Alkaline Phosphatase: 59 IU/L (ref 39–117)
BUN/Creatinine Ratio: 21 — ABNORMAL HIGH (ref 9–20)
BUN: 22 mg/dL (ref 6–24)
Bilirubin Total: 0.5 mg/dL (ref 0.0–1.2)
CALCIUM: 9.8 mg/dL (ref 8.7–10.2)
CO2: 22 mmol/L (ref 20–29)
Chloride: 100 mmol/L (ref 96–106)
Creatinine, Ser: 1.03 mg/dL (ref 0.76–1.27)
GFR calc Af Amer: 91 mL/min/{1.73_m2} (ref >59)
GFR, EST NON AFRICAN AMERICAN: 79 mL/min/{1.73_m2} (ref >59)
GLOBULIN, TOTAL: 2.4 g/dL (ref 1.5–4.5)
GLUCOSE: 83 mg/dL (ref 65–99)
Potassium: 4.6 mmol/L (ref 3.5–5.2)
SODIUM: 138 mmol/L (ref 134–144)
Total Protein: 7.2 g/dL (ref 6.0–8.5)

## 2018-11-30 LAB — PSA: PROSTATE SPECIFIC AG, SERUM: 4 ng/mL (ref 0.0–4.0)

## 2018-11-30 LAB — LIPID PANEL
Chol/HDL Ratio: 4.5 ratio (ref 0.0–5.0)
Cholesterol, Total: 195 mg/dL (ref 100–199)
HDL: 43 mg/dL (ref >39)
LDL CALC: 122 mg/dL — AB (ref 0–99)
TRIGLYCERIDES: 148 mg/dL (ref 0–149)
VLDL Cholesterol Cal: 30 mg/dL (ref 5–40)

## 2018-11-30 MED ORDER — ALPRAZOLAM 1 MG PO TABS
ORAL_TABLET | ORAL | 1 refills | Status: DC
Start: 2018-11-30 — End: 2020-01-21

## 2018-11-30 NOTE — Telephone Encounter (Signed)
Ana from [facility] reports that alprazolam is on national backorder and they only have 1mg  tablet in stock. Can the patient get the 1mg  instead? If so, please send in new Rx. Thanks!

## 2018-11-30 NOTE — Telephone Encounter (Signed)
Have sent new prescription for the 1mg  tablets.

## 2019-02-21 ENCOUNTER — Other Ambulatory Visit: Payer: Self-pay | Admitting: Family Medicine

## 2019-02-21 DIAGNOSIS — F419 Anxiety disorder, unspecified: Secondary | ICD-10-CM

## 2019-02-21 DIAGNOSIS — F39 Unspecified mood [affective] disorder: Secondary | ICD-10-CM

## 2020-01-20 ENCOUNTER — Ambulatory Visit: Payer: BLUE CROSS/BLUE SHIELD | Admitting: Family Medicine

## 2020-01-21 ENCOUNTER — Encounter: Payer: Self-pay | Admitting: Family Medicine

## 2020-01-21 ENCOUNTER — Other Ambulatory Visit: Payer: Self-pay | Admitting: Family Medicine

## 2020-01-21 ENCOUNTER — Telehealth (INDEPENDENT_AMBULATORY_CARE_PROVIDER_SITE_OTHER): Payer: BC Managed Care – PPO | Admitting: Family Medicine

## 2020-01-21 VITALS — Ht 71.5 in | Wt 190.0 lb

## 2020-01-21 DIAGNOSIS — F419 Anxiety disorder, unspecified: Secondary | ICD-10-CM | POA: Diagnosis not present

## 2020-01-21 DIAGNOSIS — G479 Sleep disorder, unspecified: Secondary | ICD-10-CM | POA: Diagnosis not present

## 2020-01-21 MED ORDER — ALPRAZOLAM 1 MG PO TABS: ORAL_TABLET | ORAL | 1 refills | Status: DC

## 2020-01-21 NOTE — Telephone Encounter (Signed)
Patient sent message saying he didn't see the mychart message yesterday regarding his appt. At 4:00.  He just needs refills on Xanax 1 mg. Per patient.  [facility] [place].

## 2020-01-21 NOTE — Patient Instructions (Signed)
.   Please review the attached list of medications and notify my office if there are any errors.   . Please bring all of your medications to every appointment so we can make sure that our medication list is the same as yours.   

## 2020-01-21 NOTE — Progress Notes (Signed)
Patient: Eddie Cabrera Male    DOB: October 03, 1956   64 y.o.   MRN: [id number] Visit Date: 01/21/2020  Today's Provider: [name], MD   Chief Complaint  Patient presents with  . Anxiety   Subjective:    Virtual Visit via Video Note  I connected with Robbi Garter on 01/21/20 at  2:40 PM EST by a video enabled telemedicine application and verified that I am speaking with the correct person using two identifiers.  Location: Patient:  Provider: [facility]fp   I discussed the limitations of evaluation and management by telemedicine and the availability of in person appointments. The patient expressed understanding and agreed to proceed.  HPI  Follow up for anxiety  The patient was last seen for this 1 years ago. Changes made at last visit include no changes.  He reports excellent compliance with treatment. Patient reports taking 1/4 tablet about 3 times every 3 weeks.  He feels that condition is Improved. He is not having side effects.   GAD 7 : Generalized Anxiety Score 01/21/2020  Nervous, Anxious, on Edge 0  Control/stop worrying 0  Worry too much - different things 0  Trouble relaxing 0  Restless 0  Easily annoyed or irritable 0  Afraid - awful might happen 0  Total GAD 7 Score 0  Anxiety Difficulty Not difficult at all    Depression screen [facility] 2/9 01/21/2020 01/21/2020 11/23/2017  Decreased Interest 0 0 0  Down, Depressed, Hopeless 0 0 0  PHQ - 2 Score 0 0 0  Altered sleeping 3 - 0  Tired, decreased energy 1 - 0  Change in appetite 0 - 0  Feeling bad or failure about yourself  0 - 0  Trouble concentrating 0 - 0  Moving slowly or fidgety/restless 0 - 0  Suicidal thoughts 0 - 0  PHQ-9 Score 4 - 0  Difficult doing work/chores Not difficult at all - Not difficult at all    ------------------------------------------------------------------------------------   No Known Allergies   Current Outpatient Medications:  .  ALPRAZolam (XANAX) 1 MG tablet, 1/4-1/2  tablet daily as needed for anxiety or sleep, Disp: 15 tablet, Rfl: 1 .  Multiple Vitamin (MULTIVITAMIN) tablet, Take 1 tablet by mouth daily., Disp: , Rfl:  .  sertraline (ZOLOFT) 100 MG tablet, TAKE 1-2 TABLETS (100-200 MG TOTAL) BY MOUTH DAILY., Disp: 180 tablet, Rfl: 4  Review of Systems  Constitutional: Negative for activity change, appetite change and fatigue.  Respiratory: Negative for cough and wheezing.   Cardiovascular: Negative for chest pain, palpitations and leg swelling.  Psychiatric/Behavioral: Positive for sleep disturbance. Negative for agitation and decreased concentration.    Social History   Tobacco Use  . Smoking status: Never Smoker  . Smokeless tobacco: Never Used  Substance Use Topics  . Alcohol use: Yes    Alcohol/week: 7.0 standard drinks    Types: 7 Cans of beer per week    Comment: moderate use      Objective:   Ht 5' 11.5" (1.816 m)   Wt 190 lb (86.2 kg)   BMI 26.13 kg/m  Vitals:   01/21/20 1437  Weight: 190 lb (86.2 kg)  Height: 5' 11.5" (1.816 m)  Body mass index is 26.13 kg/m.   Physical Exam   No results found for any visits on 01/21/20.     Assessment & Plan    I discussed the assessment and treatment plan with the patient. The patient was provided an opportunity to  ask questions and all were answered. The patient agreed with the plan and demonstrated an understanding of the instructions.   The patient was advised to call back or seek an in-person evaluation if the symptoms worsen or if the condition fails to improve as anticipated.  I provided 8 minutes of non-face-to-face time during this encounter.  The entirety of the information documented in the History of Present Illness, Review of Systems and Physical Exam were personally obtained by me. Portions of this information were initially documented by Lynford Humphrey, CMA and reviewed by me for thoroughness and accuracy.      [name], MD  [facility]

## 2020-01-21 NOTE — Telephone Encounter (Signed)
appt scheduled for today

## 2020-01-25 ENCOUNTER — Encounter: Payer: Self-pay | Admitting: Family Medicine

## 2020-03-26 ENCOUNTER — Ambulatory Visit: Payer: No Typology Code available for payment source | Attending: Internal Medicine

## 2020-03-26 DIAGNOSIS — Z23 Encounter for immunization: Secondary | ICD-10-CM

## 2020-03-26 NOTE — Progress Notes (Signed)
   Covid-19 Vaccination Clinic  Name:  Eddie Cabrera    MRN: [id number] DOB: 09-01-1956  03/26/2020  [name] was observed post Covid-19 immunization for 15 minutes without incident. He was provided with Vaccine Information Sheet and instruction to access the V-Safe system.   [name] was instructed to call 911 with any severe reactions post vaccine: Marland Kitchen Difficulty breathing  . Swelling of face and throat  . A fast heartbeat  . A bad rash all over body  . Dizziness and weakness   Immunizations Administered    Name Date Dose VIS Date Route   Pfizer COVID-19 Vaccine 03/26/2020 10:30 AM 0.3 mL 12/06/2019 Intramuscular   Manufacturer: [employer], [employer]   Lot: [id number]   [place]: [id number]

## 2020-04-16 ENCOUNTER — Other Ambulatory Visit: Payer: Self-pay | Admitting: Family Medicine

## 2020-04-16 DIAGNOSIS — F39 Unspecified mood [affective] disorder: Secondary | ICD-10-CM

## 2020-04-16 DIAGNOSIS — F419 Anxiety disorder, unspecified: Secondary | ICD-10-CM

## 2020-04-20 ENCOUNTER — Ambulatory Visit: Payer: No Typology Code available for payment source | Attending: Internal Medicine

## 2020-04-20 DIAGNOSIS — Z23 Encounter for immunization: Secondary | ICD-10-CM

## 2020-04-20 NOTE — Progress Notes (Signed)
   Covid-19 Vaccination Clinic  Name:  DANNIEL HAZEL    MRN: [id number] DOB: 1956-10-15  04/20/2020  Mr. Pinard was observed post Covid-19 immunization for 15 minutes without incident. He was provided with Vaccine Information Sheet and instruction to access the V-Safe system.   Mr. Forgette was instructed to call 911 with any severe reactions post vaccine: Marland Kitchen Difficulty breathing  . Swelling of face and throat  . A fast heartbeat  . A bad rash all over body  . Dizziness and weakness   Immunizations Administered    Name Date Dose VIS Date Route   Pfizer COVID-19 Vaccine 04/20/2020  9:11 AM 0.3 mL 02/19/2019 Intramuscular   Manufacturer: [place]   Lot: [id number]   [place]: [id number]

## 2020-07-19 ENCOUNTER — Other Ambulatory Visit: Payer: Self-pay | Admitting: Family Medicine

## 2020-07-19 DIAGNOSIS — G479 Sleep disorder, unspecified: Secondary | ICD-10-CM

## 2020-07-19 NOTE — Telephone Encounter (Signed)
Requested medications are due for refill today?  Yes - This medication refill cannot be delegated.    Requested medications are on active medication list?  Yes  Last Refill:   01/21/2020  # 15 with one refill   Future visit scheduled?  No   Notes to Clinic:  This medication refill cannot be delegated.

## 2020-09-09 ENCOUNTER — Other Ambulatory Visit: Payer: Self-pay | Admitting: Family Medicine

## 2020-09-09 DIAGNOSIS — G479 Sleep disorder, unspecified: Secondary | ICD-10-CM

## 2020-12-02 ENCOUNTER — Encounter: Payer: Self-pay | Admitting: Family Medicine

## 2020-12-02 ENCOUNTER — Other Ambulatory Visit: Payer: Self-pay | Admitting: Family Medicine

## 2020-12-02 DIAGNOSIS — F39 Unspecified mood [affective] disorder: Secondary | ICD-10-CM

## 2020-12-02 DIAGNOSIS — F419 Anxiety disorder, unspecified: Secondary | ICD-10-CM

## 2020-12-02 NOTE — Telephone Encounter (Signed)
Requested medication (s) are due for refill today: yes   Requested medication (s) are on the active medication list: yes   Last refill: 09/09/2020  Future visit scheduled: no  Notes to clinic:  overdue for 6 month follow up Vm left for patient to callback and schedule    Requested Prescriptions  Pending Prescriptions Disp Refills   sertraline (ZOLOFT) 100 MG tablet [Pharmacy Med Name: SERTRALINE HCL 100 MG TABLET] 180 tablet 0    Sig: TAKE 1-2 TABLETS (100-200 MG TOTAL) BY MOUTH DAILY.      Psychiatry:  Antidepressants - SSRI Failed - 12/02/2020  1:25 AM      Failed - Valid encounter within last 6 months    Recent Outpatient Visits           10 months ago Sleep disturbance   [facility] Birdie Sons, MD   2 years ago Annual physical exam   [facility] Birdie Sons, MD   2 years ago Dupuytren's contracture of left hand   [facility] Birdie Sons, MD   3 years ago Annual physical exam   [facility] Birdie Sons, MD   4 years ago Annual physical exam   [place], [place]              Passed - Completed PHQ-2 or PHQ-9 in the last 360 days

## 2020-12-21 NOTE — Progress Notes (Signed)
I,[name],acting as a scribe for Eddie Huh, MD.,have documented all relevant documentation on the behalf of Eddie Huh, MD,as directed by  Eddie Huh, MD while in the presence of Eddie Huh, MD.   Complete physical exam   Patient: Eddie Cabrera   DOB: 03/22/1956   64 y.o. Male  MRN: [id number] Visit Date: 12/22/2020  Today's healthcare provider: [name], MD   Chief Complaint  Patient presents with  . Annual Exam   Subjective    Eddie Cabrera is a 64 y.o. male who presents today for a complete physical exam.  He reports consuming a general diet. The patient does not participate in regular exercise at present. He generally feels well. He reports sleeping fairly well. He does not have additional problems to discuss today.  He continues on sertraline 100-200mg  daily to help with anxiety which is working very well and having no adverse effects. He takes occasional 1/4 to 1/2 alprazolam to help sleep, typically no more than once a week. He wishes to continue taking current medications.   Past Medical History:  Diagnosis Date  . History of chicken pox   . Wears contact lenses    Past Surgical History:  Procedure Laterality Date  . RHINOPLASTY     AGE 23 and again at age 88- history of deviated Septum   Social History   Socioeconomic History  . Marital status: Single    Spouse name: Not on file  . Number of children: 1  . Years of education: Not on file  . Highest education level: Not on file  Occupational History  . Not on file  Tobacco Use  . Smoking status: Never Smoker  . Smokeless tobacco: Never Used  Substance and Sexual Activity  . Alcohol use: Yes    Alcohol/week: 7.0 standard drinks    Types: 7 Cans of beer per week    Comment: moderate use  . Drug use: No  . Sexual activity: Not on file  Other Topics Concern  . Not on file  Social History Narrative  . Not on file   Social Determinants of Health   Financial Resource Strain: Not  on file  Food Insecurity: Not on file  Transportation Needs: Not on file  Physical Activity: Not on file  Stress: Not on file  Social Connections: Not on file  Intimate Partner Violence: Not on file   Family Status  Relation Name Status  . Mother  Deceased       died in her 47's  . Father  Deceased at age 47       kidney failure  . Sister  Alive  . Neg Hx  (Not Specified)   Family History  Problem Relation Age of Onset  . Bladder Cancer Mother   . Kidney failure Father   . Colon cancer Neg Hx   . Prostate cancer Neg Hx    No Known Allergies  Patient Care Team: Birdie Sons, MD as PCP - General (Family Medicine)   Medications: Outpatient Medications Prior to Visit  Medication Sig  . ALPRAZolam (XANAX) 1 MG tablet TAKE 1/4-1/2 TABLET DAILY AS NEEDED FOR ANXIETY OR SLEEP  . Multiple Vitamin (MULTIVITAMIN) tablet Take 1 tablet by mouth daily.  . sertraline (ZOLOFT) 100 MG tablet Take 1-2 tablets (100-200 mg total) by mouth daily. Please schedule an office visit before anymore refills.   No facility-administered medications prior to visit.    Review of Systems  HENT: Positive for  postnasal drip.   Neurological: Positive for headaches.  All other systems reviewed and are negative.     Objective    BP (!) 130/93 (BP Location: Right Arm, Patient Position: Sitting, Cuff Size: Large)   Pulse 76   Temp 98.1 F (36.7 C) (Oral)   Resp 16   Ht 6' (1.829 m)   Wt 195 lb (88.5 kg)   SpO2 95%   BMI 26.45 kg/m    Physical Exam    General Appearance:     Overweight male. Alert, cooperative, in no acute distress, appears stated age  Head:    Normocephalic, without obvious abnormality, atraumatic  Eyes:    PERRL, conjunctiva/corneas clear, EOM's intact, fundi    benign, both eyes       Ears:    Normal TM's and external ear canals, both ears  Nose:   Nares normal, septum midline, mucosa normal, no drainage   or sinus tenderness  Throat:   Lips, mucosa, and tongue  normal; teeth and gums normal  Neck:   Supple, symmetrical, trachea midline, no adenopathy;       thyroid:  No enlargement/tenderness/nodules; no carotid   bruit or JVD  Back:     Symmetric, no curvature, ROM normal, no CVA tenderness  Lungs:     Clear to auscultation bilaterally, respirations unlabored  Chest wall:    No tenderness or deformity  Heart:    Normal heart rate. Normal rhythm. No murmurs, rubs, or gallops.  S1 and S2 normal  Abdomen:     Soft, non-tender, bowel sounds active all four quadrants,    no masses, no organomegaly  Genitalia:    deferred  Rectal:    deferred  Extremities:   All extremities are intact. No cyanosis or edema  Pulses:   2+ and symmetric all extremities  Skin:   Skin color, texture, turgor normal, no rashes or lesions  Lymph nodes:   Cervical, supraclavicular, and axillary nodes normal  Neurologic:   CNII-XII intact. Normal strength, sensation and reflexes      throughout    Last depression screening scores PHQ 2/9 Scores 12/22/2020 01/21/2020 01/21/2020  PHQ - 2 Score 0 0 0  PHQ- 9 Score 0 4 -   Last fall risk screening Fall Risk  01/21/2020  Falls in the past year? 0  Number falls in past yr: 0  Injury with Fall? 0  Risk for fall due to : No Fall Risks  Follow up Falls evaluation completed   Last Audit-C alcohol use screening Alcohol Use Disorder Test (AUDIT) 12/22/2020  1. How often do you have a drink containing alcohol? 3  2. How many drinks containing alcohol do you have on a typical day when you are drinking? 0  3. How often do you have six or more drinks on one occasion? 0  AUDIT-C Score 3  4. How often during the last year have you found that you were not able to stop drinking once you had started? 0  5. How often during the last year have you failed to do what was normally expected from you because of drinking? 0  6. How often during the last year have you needed a first drink in the morning to get yourself going after a heavy drinking  session? 0  7. How often during the last year have you had a feeling of guilt of remorse after drinking? 0  8. How often during the last year have you been unable to remember what  happened the night before because you had been drinking? 0  9. Have you or someone else been injured as a result of your drinking? 0  10. Has a relative or friend or a doctor or another health worker been concerned about your drinking or suggested you cut down? 0  Alcohol Use Disorder Identification Test Final Score (AUDIT) 3  Alcohol Brief Interventions/Follow-up AUDIT Score <7 follow-up not indicated   A score of 3 or more in women, and 4 or more in men indicates increased risk for alcohol abuse, EXCEPT if all of the points are from question 1   No results found for any visits on 12/22/20.  Assessment & Plan    Routine Health Maintenance and Physical Exam  Exercise Activities and Dietary recommendations Goals   None     Immunization History  Administered Date(s) Administered  . Influenza,inj,Quad PF,6+ Mos 10/26/2015, 09/14/2016, 11/23/2017, 11/29/2018, 11/17/2020  . PFIZER SARS-COV-2 Vaccination 03/26/2020, 04/20/2020, 11/17/2020  . Td 11/29/2018  . Tdap 05/28/2008  . Zoster Recombinat (Shingrix) 12/12/2017, 02/13/2018    Health Maintenance  Topic Date Due  . HIV Screening  Never done  . Fecal DNA (Cologuard)  02/08/2021  . TETANUS/TDAP  11/29/2028  . INFLUENZA VACCINE  Completed  . COVID-19 Vaccine  Completed  . Hepatitis C Screening  Completed    Discussed health benefits of physical activity, and encouraged him to engage in regular exercise appropriate for his age and condition.  1. Annual physical exam  - Comprehensive metabolic panel - Lipid panel - CBC  2. Anxiety Doing very well on current dose of sertraline which he wishes to continue. Reminded that if he decides to stop it to call for tapering schedule.   3. Sleep disturbance Doing well with occasional alprazolam hs.   4.  Colon cancer screening  - Cologuard  5. Prostate cancer screening  - PSA Total (Reflex To Free) ([facility] only)  6. Episodic mood disorder (HCC) Very well controlled with sertraline.       The entirety of the information documented in the History of Present Illness, Review of Systems and Physical Exam were personally obtained by me. Portions of this information were initially documented by the CMA and reviewed by me for thoroughness and accuracy.      Mila Merry, MD  [facility] [phone] (phone) [phone] (fax)  [facility]

## 2020-12-22 ENCOUNTER — Other Ambulatory Visit: Payer: Self-pay

## 2020-12-22 ENCOUNTER — Ambulatory Visit (INDEPENDENT_AMBULATORY_CARE_PROVIDER_SITE_OTHER): Payer: 59 | Admitting: Family Medicine

## 2020-12-22 ENCOUNTER — Encounter: Payer: Self-pay | Admitting: Family Medicine

## 2020-12-22 VITALS — BP 130/93 | HR 76 | Temp 98.1°F | Resp 16 | Ht 72.0 in | Wt 195.0 lb

## 2020-12-22 DIAGNOSIS — Z1211 Encounter for screening for malignant neoplasm of colon: Secondary | ICD-10-CM | POA: Diagnosis not present

## 2020-12-22 DIAGNOSIS — G479 Sleep disorder, unspecified: Secondary | ICD-10-CM

## 2020-12-22 DIAGNOSIS — F419 Anxiety disorder, unspecified: Secondary | ICD-10-CM

## 2020-12-22 DIAGNOSIS — Z0000 Encounter for general adult medical examination without abnormal findings: Secondary | ICD-10-CM

## 2020-12-22 DIAGNOSIS — F39 Unspecified mood [affective] disorder: Secondary | ICD-10-CM

## 2020-12-22 DIAGNOSIS — Z125 Encounter for screening for malignant neoplasm of prostate: Secondary | ICD-10-CM

## 2020-12-23 LAB — COMPREHENSIVE METABOLIC PANEL
ALT: 17 IU/L (ref 0–44)
AST: 16 IU/L (ref 0–40)
Albumin/Globulin Ratio: 2.1 (ref 1.2–2.2)
Albumin: 4.6 g/dL (ref 3.8–4.8)
Alkaline Phosphatase: 68 IU/L (ref 44–121)
BUN/Creatinine Ratio: 19 (ref 10–24)
BUN: 18 mg/dL (ref 8–27)
Bilirubin Total: 0.2 mg/dL (ref 0.0–1.2)
CO2: 22 mmol/L (ref 20–29)
Calcium: 9.9 mg/dL (ref 8.6–10.2)
Chloride: 105 mmol/L (ref 96–106)
Creatinine, Ser: 0.96 mg/dL (ref 0.76–1.27)
GFR calc Af Amer: 98 mL/min/{1.73_m2} (ref >59)
GFR calc non Af Amer: 84 mL/min/{1.73_m2} (ref >59)
Globulin, Total: 2.2 g/dL (ref 1.5–4.5)
Glucose: 88 mg/dL (ref 65–99)
Potassium: 4.5 mmol/L (ref 3.5–5.2)
Sodium: 141 mmol/L (ref 134–144)
Total Protein: 6.8 g/dL (ref 6.0–8.5)

## 2020-12-23 LAB — CBC
Hematocrit: 44.3 % (ref 37.5–51.0)
Hemoglobin: 15.1 g/dL (ref 13.0–17.7)
MCH: 32.4 pg (ref 26.6–33.0)
MCHC: 34.1 g/dL (ref 31.5–35.7)
MCV: 95 fL (ref 79–97)
Platelets: 260 10*3/uL (ref 150–450)
RBC: 4.66 x10E6/uL (ref 4.14–5.80)
RDW: 12.7 % (ref 11.6–15.4)
WBC: 5.7 10*3/uL (ref 3.4–10.8)

## 2020-12-23 LAB — PSA TOTAL (REFLEX TO FREE): Prostate Specific Ag, Serum: 11.9 ng/mL — ABNORMAL HIGH (ref 0.0–4.0)

## 2020-12-23 LAB — LIPID PANEL
Chol/HDL Ratio: 4.3 ratio (ref 0.0–5.0)
Cholesterol, Total: 180 mg/dL (ref 100–199)
HDL: 42 mg/dL (ref >39)
LDL Chol Calc (NIH): 110 mg/dL — ABNORMAL HIGH (ref 0–99)
Triglycerides: 159 mg/dL — ABNORMAL HIGH (ref 0–149)
VLDL Cholesterol Cal: 28 mg/dL (ref 5–40)

## 2021-01-02 ENCOUNTER — Other Ambulatory Visit: Payer: Self-pay | Admitting: Family Medicine

## 2021-01-02 DIAGNOSIS — F419 Anxiety disorder, unspecified: Secondary | ICD-10-CM

## 2021-01-02 DIAGNOSIS — F39 Unspecified mood [affective] disorder: Secondary | ICD-10-CM

## 2021-01-03 NOTE — Telephone Encounter (Signed)
Requested Prescriptions  Pending Prescriptions Disp Refills  . sertraline (ZOLOFT) 100 MG tablet [Pharmacy Med Name: SERTRALINE HCL 100 MG TABLET] 180 tablet 1    Sig: TAKE 1-2 TABLETS (100-200 MG TOTAL) BY MOUTH DAILY     Psychiatry:  Antidepressants - SSRI Passed - 01/02/2021 11:32 AM      Passed - Completed PHQ-2 or PHQ-9 in the last 360 days      Passed - Valid encounter within last 6 months    Recent Outpatient Visits          1 week ago Annual physical exam   [facility] Birdie Sons, MD   11 months ago Sleep disturbance   [facility] Birdie Sons, MD   2 years ago Annual physical exam   [facility] Birdie Sons, MD   2 years ago Dupuytren's contracture of left hand   [facility] Birdie Sons, MD   3 years ago Annual physical exam   [facility] Birdie Sons, MD

## 2021-01-15 ENCOUNTER — Encounter: Payer: Self-pay | Admitting: Family Medicine

## 2021-01-15 ENCOUNTER — Other Ambulatory Visit: Payer: Self-pay | Admitting: Family Medicine

## 2021-01-15 DIAGNOSIS — R972 Elevated prostate specific antigen [PSA]: Secondary | ICD-10-CM

## 2021-01-15 NOTE — Progress Notes (Unsigned)
Recheck PSA 

## 2021-01-22 ENCOUNTER — Other Ambulatory Visit: Payer: Self-pay

## 2021-01-22 DIAGNOSIS — R972 Elevated prostate specific antigen [PSA]: Secondary | ICD-10-CM

## 2021-01-23 LAB — FPSA% REFLEX
% FREE PSA: 13.8 %
PSA, FREE: 0.95 ng/mL

## 2021-01-23 LAB — PSA TOTAL (REFLEX TO FREE): Prostate Specific Ag, Serum: 6.9 ng/mL — ABNORMAL HIGH (ref 0.0–4.0)

## 2021-02-16 ENCOUNTER — Telehealth: Payer: Self-pay | Admitting: Family Medicine

## 2021-02-16 DIAGNOSIS — R972 Elevated prostate specific antigen [PSA]: Secondary | ICD-10-CM

## 2021-02-16 NOTE — Telephone Encounter (Signed)
Please advise is time to recheck PSA. Print and leave order at front. Thanks.

## 2021-02-16 NOTE — Telephone Encounter (Signed)
Left message to call back. Ok for [facility] to advise as below.

## 2021-02-17 NOTE — Telephone Encounter (Signed)
Patient advised and lab slip placed at front desk.

## 2021-02-20 LAB — PSA: Prostate Specific Ag, Serum: 6.4 ng/mL — ABNORMAL HIGH (ref 0.0–4.0)

## 2021-02-22 ENCOUNTER — Encounter: Payer: Self-pay | Admitting: Family Medicine

## 2021-02-22 DIAGNOSIS — R972 Elevated prostate specific antigen [PSA]: Secondary | ICD-10-CM

## 2021-03-03 ENCOUNTER — Other Ambulatory Visit: Payer: Self-pay

## 2021-03-03 ENCOUNTER — Ambulatory Visit (INDEPENDENT_AMBULATORY_CARE_PROVIDER_SITE_OTHER): Payer: 59 | Admitting: Urology

## 2021-03-03 ENCOUNTER — Encounter: Payer: Self-pay | Admitting: Urology

## 2021-03-03 VITALS — BP 150/100 | HR 90 | Ht 72.0 in | Wt 190.0 lb

## 2021-03-03 DIAGNOSIS — R972 Elevated prostate specific antigen [PSA]: Secondary | ICD-10-CM | POA: Diagnosis not present

## 2021-03-03 NOTE — Progress Notes (Signed)
03/03/2021 1:34 PM   Aja Bolander Beilfuss [birth year] [id number]  Referring provider: Birdie Sons, [address],  [place] [address]  Chief Complaint  Patient presents with  . Elevated PSA    HPI: Eddie Cabrera is a 65 y.o. male referred for evaluation of an elevated PSA.   PSA drawn 12/22/2020 elevated 11.9  Prior PSA level 4.0  Persistent elevation on repeats January and February 2022 as below  No bothersome LUTS  Denies dysuria, gross hematuria  Denies flank, abdominal or pelvic pain  No family history prostate cancer    PMH: Past Medical History:  Diagnosis Date  . History of chicken pox   . Wears contact lenses     Surgical History: Past Surgical History:  Procedure Laterality Date  . RHINOPLASTY     AGE 54 and again at age 21- history of deviated Septum    Home Medications:  Allergies as of 03/03/2021   No Known Allergies     Medication List       Accurate as of March 03, 2021  1:34 PM. If you have any questions, ask your nurse or doctor.        ALPRAZolam 1 MG tablet Commonly known as: XANAX TAKE 1/4-1/2 TABLET DAILY AS NEEDED FOR ANXIETY OR SLEEP   multivitamin tablet Take 1 tablet by mouth daily.   sertraline 100 MG tablet Commonly known as: ZOLOFT TAKE 1-2 TABLETS (100-200 MG TOTAL) BY MOUTH DAILY       Allergies: No Known Allergies  Family History: Family History  Problem Relation Age of Onset  . Bladder Cancer Mother   . Kidney failure Father   . Colon cancer Neg Hx   . Prostate cancer Neg Hx     Social History:  reports that he has never smoked. He has never used smokeless tobacco. He reports current alcohol use of about 7.0 standard drinks of alcohol per week. He reports that he does not use drugs.   Physical Exam: BP (!) 150/100   Pulse 90   Ht 6' (1.829 m)   Wt 190 lb (86.2 kg)   BMI 25.77 kg/m   Constitutional:  Alert and oriented, No acute distress. HEENT: [place] AT, moist mucus membranes.   Trachea midline, no masses. Cardiovascular: No clubbing, cyanosis, or edema. Respiratory: Normal respiratory effort, no increased work of breathing. GU: Prostate 40 g, smooth without nodules Neurologic: Grossly intact, no focal deficits, moving all 4 extremities. Psychiatric: Normal mood and affect.   Assessment & Plan:    1.  Elevated PSA  Although PSA is a prostate cancer screening test he was informed that cancer is not the most common cause of an elevated PSA. Other potential causes including BPH and inflammation were discussed. He was informed that the only way to adequately diagnose prostate cancer would be a transrectal ultrasound and biopsy of the prostate. The procedure was discussed including potential risks of bleeding and infection/sepsis. He was also informed that a negative biopsy does not conclusively rule out the possibility that prostate cancer may be present and that continued monitoring is required. The use of newer adjunctive blood tests including 4kScore were discussed. The use of multiparametric prostate MRI to identify lesions suspicious for high-grade prostate cancer as well as continued periodic surveillance was also discussed.  Urinalysis was ordered and if no evidence of pyuria/bacteriuria he would like to proceed with a prostate MRI.    Abbie Sons, [facility] [address]  [address], [place], [place] [address] [phone]

## 2021-03-04 ENCOUNTER — Encounter: Payer: Self-pay | Admitting: Urology

## 2021-03-05 LAB — URINALYSIS, COMPLETE
Bilirubin, UA: NEGATIVE
Glucose, UA: NEGATIVE
Ketones, UA: NEGATIVE
Leukocytes,UA: NEGATIVE
Nitrite, UA: NEGATIVE
Protein,UA: NEGATIVE
RBC, UA: NEGATIVE
Specific Gravity, UA: 1.02 (ref 1.005–1.030)
Urobilinogen, Ur: 0.2 mg/dL (ref 0.2–1.0)
pH, UA: 5 (ref 5.0–7.5)

## 2021-03-05 LAB — MICROSCOPIC EXAMINATION
Bacteria, UA: NONE SEEN
Epithelial Cells (non renal): NONE SEEN /hpf (ref 0–10)

## 2021-03-06 ENCOUNTER — Encounter: Payer: Self-pay | Admitting: Urology

## 2021-03-07 LAB — COLOGUARD: Cologuard: POSITIVE — AB

## 2021-03-09 DIAGNOSIS — C801 Malignant (primary) neoplasm, unspecified: Secondary | ICD-10-CM

## 2021-03-09 HISTORY — DX: Malignant (primary) neoplasm, unspecified: C80.1

## 2021-03-15 ENCOUNTER — Encounter: Payer: Self-pay | Admitting: Family Medicine

## 2021-03-16 ENCOUNTER — Telehealth: Payer: Self-pay

## 2021-03-16 DIAGNOSIS — R195 Other fecal abnormalities: Secondary | ICD-10-CM

## 2021-03-16 NOTE — Telephone Encounter (Signed)
Copied from [place] [id number]. Topic: General - Other >> Mar 16, 2021  9:01 AM Yvette Rack wrote: Reason for CRM: Pt request return call to discuss cologuard results. Cb# [phone]

## 2021-03-16 NOTE — Telephone Encounter (Signed)
I called pt and informed pt of positive cologuard test result. Pt verbalized understanding and referral has been placed.

## 2021-03-18 ENCOUNTER — Telehealth (INDEPENDENT_AMBULATORY_CARE_PROVIDER_SITE_OTHER): Payer: Self-pay | Admitting: Gastroenterology

## 2021-03-18 ENCOUNTER — Other Ambulatory Visit: Payer: Self-pay

## 2021-03-18 DIAGNOSIS — R195 Other fecal abnormalities: Secondary | ICD-10-CM

## 2021-03-18 DIAGNOSIS — M542 Cervicalgia: Secondary | ICD-10-CM | POA: Insufficient documentation

## 2021-03-18 DIAGNOSIS — R197 Diarrhea, unspecified: Secondary | ICD-10-CM | POA: Insufficient documentation

## 2021-03-18 MED ORDER — PEG 3350-KCL-NA BICARB-NACL 420 G PO SOLR: 4000.0000 mL | Freq: Once | ORAL | 0 refills | Status: AC

## 2021-03-18 NOTE — Progress Notes (Signed)
Gastroenterology Pre-Procedure Review  Request Date: Tuesday 03/23/21 Requesting Physician: Dr. Vicente Males  PATIENT REVIEW QUESTIONS: The patient responded to the following health history questions as indicated:    1. Are you having any GI issues? Referral for positive cologuard.  Pt states no GI Symptoms 2. Do you have a personal history of Polyps? no First colonoscopy 3. Do you have a family history of Colon Cancer or Polyps? no 4. Diabetes Mellitus? no 5. Joint replacements in the past 12 months?no 6. Major health problems in the past 3 months?no 7. Any artificial heart valves, MVP, or defibrillator?no    MEDICATIONS & ALLERGIES:    Patient reports the following regarding taking any anticoagulation/antiplatelet therapy:   Plavix, Coumadin, Eliquis, Xarelto, Lovenox, Pradaxa, Brilinta, or Effient? no Aspirin? no  Patient confirms/reports the following medications:  Current Outpatient Medications  Medication Sig Dispense Refill  . ALPRAZolam (XANAX) 1 MG tablet TAKE 1/4-1/2 TABLET DAILY AS NEEDED FOR ANXIETY OR SLEEP 15 tablet 5  . sertraline (ZOLOFT) 100 MG tablet TAKE 1-2 TABLETS (100-200 MG TOTAL) BY MOUTH DAILY 180 tablet 3  . Multiple Vitamin (MULTIVITAMIN) tablet Take 1 tablet by mouth daily. (Patient not taking: Reported on 03/18/2021)     No current facility-administered medications for this visit.    Patient confirms/reports the following allergies:  No Known Allergies  No orders of the defined types were placed in this encounter.   AUTHORIZATION INFORMATION Primary Insurance: 1D#: Group #:  Secondary Insurance: 1D#: Group #:  SCHEDULE INFORMATION: Date: Tuesday 03/23/21 Time: Location:[facility]

## 2021-03-19 ENCOUNTER — Other Ambulatory Visit: Payer: Self-pay

## 2021-03-19 ENCOUNTER — Other Ambulatory Visit
Admission: RE | Admit: 2021-03-19 | Discharge: 2021-03-19 | Disposition: A | Discharge status: Home / Self Care | Payer: 59 | Source: Ambulatory Visit | Attending: Gastroenterology | Admitting: Gastroenterology

## 2021-03-19 DIAGNOSIS — Z20822 Contact with and (suspected) exposure to covid-19: Secondary | ICD-10-CM | POA: Diagnosis not present

## 2021-03-19 DIAGNOSIS — Z01812 Encounter for preprocedural laboratory examination: Secondary | ICD-10-CM | POA: Diagnosis present

## 2021-03-20 LAB — SARS CORONAVIRUS 2 (TAT 6-24 HRS): SARS Coronavirus 2: NEGATIVE

## 2021-03-22 ENCOUNTER — Ambulatory Visit
Admission: RE | Admit: 2021-03-22 | Discharge: 2021-03-22 | Disposition: A | Discharge status: Home / Self Care | Payer: 59 | Source: Ambulatory Visit | Attending: Urology | Admitting: Urology

## 2021-03-22 ENCOUNTER — Encounter: Payer: Self-pay | Admitting: Gastroenterology

## 2021-03-22 ENCOUNTER — Other Ambulatory Visit: Payer: Self-pay

## 2021-03-22 DIAGNOSIS — R972 Elevated prostate specific antigen [PSA]: Secondary | ICD-10-CM | POA: Insufficient documentation

## 2021-03-22 MED ORDER — GADOBUTROL 1 MMOL/ML IV SOLN
8.0000 mL | Freq: Once | INTRAVENOUS | Status: AC | PRN
  Administered 2021-03-22: 8 mL via INTRAVENOUS

## 2021-03-23 ENCOUNTER — Ambulatory Visit
Admission: RE | Admit: 2021-03-23 | Discharge: 2021-03-23 | Disposition: A | Discharge status: Home / Self Care | Payer: 59 | Attending: Gastroenterology | Admitting: Gastroenterology

## 2021-03-23 ENCOUNTER — Ambulatory Visit: Payer: 59 | Admitting: Certified Registered Nurse Anesthetist

## 2021-03-23 ENCOUNTER — Encounter
Admission: RE | Disposition: A | Discharge status: Home / Self Care | Payer: Self-pay | Source: Home / Self Care | Attending: Gastroenterology

## 2021-03-23 ENCOUNTER — Other Ambulatory Visit: Payer: Self-pay

## 2021-03-23 ENCOUNTER — Encounter: Payer: Self-pay | Admitting: Gastroenterology

## 2021-03-23 DIAGNOSIS — R195 Other fecal abnormalities: Secondary | ICD-10-CM | POA: Diagnosis not present

## 2021-03-23 DIAGNOSIS — Z79899 Other long term (current) drug therapy: Secondary | ICD-10-CM | POA: Diagnosis not present

## 2021-03-23 DIAGNOSIS — K640 First degree hemorrhoids: Secondary | ICD-10-CM | POA: Diagnosis not present

## 2021-03-23 DIAGNOSIS — K5730 Diverticulosis of large intestine without perforation or abscess without bleeding: Secondary | ICD-10-CM | POA: Insufficient documentation

## 2021-03-23 HISTORY — PX: COLONOSCOPY WITH PROPOFOL: SHX5780

## 2021-03-23 SURGERY — COLONOSCOPY WITH PROPOFOL
Anesthesia: General

## 2021-03-23 MED ORDER — LIDOCAINE HCL (CARDIAC) PF 100 MG/5ML IV SOSY
PREFILLED_SYRINGE | INTRAVENOUS | Status: DC | PRN
  Administered 2021-03-23: 50 mg via INTRAVENOUS

## 2021-03-23 MED ORDER — LIDOCAINE HCL (PF) 2 % IJ SOLN
INTRAMUSCULAR | Status: AC
  Filled 2021-03-23: qty 5

## 2021-03-23 MED ORDER — SODIUM CHLORIDE 0.9 % IV SOLN: INTRAVENOUS | Status: DC

## 2021-03-23 MED ORDER — PROPOFOL 500 MG/50ML IV EMUL
INTRAVENOUS | Status: AC
  Filled 2021-03-23: qty 50

## 2021-03-23 MED ORDER — PROPOFOL 10 MG/ML IV BOLUS
INTRAVENOUS | Status: DC | PRN
  Administered 2021-03-23: 30 mg via INTRAVENOUS
  Administered 2021-03-23: 70 mg via INTRAVENOUS

## 2021-03-23 MED ORDER — PROPOFOL 500 MG/50ML IV EMUL
INTRAVENOUS | Status: DC | PRN
  Administered 2021-03-23: 200 ug/kg/min via INTRAVENOUS

## 2021-03-23 NOTE — Op Note (Signed)
[facility] Gastroenterology Patient Name: Eddie Cabrera Procedure Date: 03/23/2021 7:46 AM MRN: [id number] Account #: [id number] Date of Birth: [birth year] Admit Type: Outpatient Age: 65 Room: [facility] ENDO ROOM 2 Gender: Male Note Status: Finalized Procedure:             Colonoscopy Indications:           Positive Cologuard test Providers:             Jonathon Bellows MD, MD Referring MD:          Kirstie Peri. Caryn Section, MD (Referring MD) Medicines:             Monitored Anesthesia Care Complications:         No immediate complications. Procedure:             Pre-Anesthesia Assessment:                        - Prior to the procedure, a History and Physical was                         performed, and patient medications, allergies and                         sensitivities were reviewed. The patient's tolerance                         of previous anesthesia was reviewed.                        - The risks and benefits of the procedure and the                         sedation options and risks were discussed with the                         patient. All questions were answered and informed                         consent was obtained.                        - ASA Grade Assessment: II - A patient with mild                         systemic disease.                        After obtaining informed consent, the colonoscope was                         passed under direct vision. Throughout the procedure,                         the patient's blood pressure, pulse, and oxygen                         saturations were monitored continuously. The                         Colonoscope was introduced through the anus and  advanced to the the cecum, identified by the                         appendiceal orifice. The colonoscopy was performed                         with ease. The patient tolerated the procedure well.                         The quality of the bowel  preparation was excellent. Findings:      The perianal and digital rectal examinations were normal.      Multiple small-mouthed diverticula were found in the left colon.      Non-bleeding internal hemorrhoids were found during retroflexion. The       hemorrhoids were large and Grade I (internal hemorrhoids that do not       prolapse).      The exam was otherwise without abnormality on direct and retroflexion       views. Impression:            - Diverticulosis in the left colon.                        - Non-bleeding internal hemorrhoids.                        - The examination was otherwise normal on direct and                         retroflexion views.                        - No specimens collected. Recommendation:        - Discharge patient to home (with escort).                        - Resume previous diet.                        - Continue present medications.                        - Repeat colonoscopy in 10 years for screening                         purposes. Procedure Code(s):     --- Professional ---                        [phone], Colonoscopy, flexible; diagnostic, including                         collection of specimen(s) by brushing or washing, when                         performed (separate procedure) Diagnosis Code(s):     --- Professional ---                        K64.0, First degree hemorrhoids  R19.5, Other fecal abnormalities                        K57.30, Diverticulosis of large intestine without                         perforation or abscess without bleeding CPT copyright 2019 American Medical Association. All rights reserved. The codes documented in this report are preliminary and upon coder review may  be revised to meet current compliance requirements. Jonathon Bellows, MD Jonathon Bellows MD, MD 03/23/2021 8:24:03 AM This report has been signed electronically. Number of Addenda: 0 Note Initiated On: 03/23/2021 7:46 AM Scope Withdrawal Time: 0  hours 9 minutes 35 seconds  Total Procedure Duration: 0 hours 15 minutes 21 seconds  Estimated Blood Loss:  Estimated blood loss: none.      [facility]

## 2021-03-23 NOTE — Anesthesia Preprocedure Evaluation (Signed)
Anesthesia Evaluation  Patient identified by MRN, date of birth, ID band Patient awake    Reviewed: Allergy & Precautions, H&P , NPO status , Patient's Chart, lab work & pertinent test results, reviewed documented beta blocker date and time   Airway Mallampati: II   Neck ROM: full    Dental  (+) Poor Dentition   Pulmonary neg pulmonary ROS,    Pulmonary exam normal        Cardiovascular Exercise Tolerance: Good negative cardio ROS Normal cardiovascular exam Rhythm:regular Rate:Normal     Neuro/Psych PSYCHIATRIC DISORDERS Anxiety Depression  Neuromuscular disease    GI/Hepatic negative GI ROS, Neg liver ROS,   Endo/Other  negative endocrine ROS  Renal/GU negative Renal ROS  negative genitourinary   Musculoskeletal   Abdominal   Peds  Hematology negative hematology ROS (+)   Anesthesia Other Findings Past Medical History: No date: Depression No date: History of chicken pox No date: Wears contact lenses Past Surgical History: No date: RHINOPLASTY     Comment:  AGE 65 and again at age 34- history of deviated Septum BMI    Body Mass Index: 26.22 kg/m     Reproductive/Obstetrics negative OB ROS                             Anesthesia Physical Anesthesia Plan  ASA: III  Anesthesia Plan: General   Post-op Pain Management:    Induction:   PONV Risk Score and Plan:   Airway Management Planned:   Additional Equipment:   Intra-op Plan:   Post-operative Plan:   Informed Consent: I have reviewed the patients History and Physical, chart, labs and discussed the procedure including the risks, benefits and alternatives for the proposed anesthesia with the patient or authorized representative who has indicated his/her understanding and acceptance.     Dental Advisory Given  Plan Discussed with: CRNA  Anesthesia Plan Comments:         Anesthesia Quick Evaluation

## 2021-03-23 NOTE — Anesthesia Postprocedure Evaluation (Signed)
Anesthesia Post Note  Patient: Eddie Cabrera  Procedure(s) Performed: COLONOSCOPY WITH PROPOFOL (N/A )  Patient location during evaluation: PACU Anesthesia Type: General Level of consciousness: awake and alert Pain management: pain level controlled Vital Signs Assessment: post-procedure vital signs reviewed and stable Respiratory status: spontaneous breathing, nonlabored ventilation, respiratory function stable and patient connected to nasal cannula oxygen Cardiovascular status: blood pressure returned to baseline and stable Postop Assessment: no apparent nausea or vomiting Anesthetic complications: no   No complications documented.   Last Vitals:  Vitals:   03/23/21 0836 03/23/21 0846  BP: 105/77 (!) 121/95  Pulse: 68 62  Resp: 14 12  Temp:    SpO2: 99% 100%    Last Pain:  Vitals:   03/23/21 0846  TempSrc:   PainSc: 0-No pain                 Molli Barrows

## 2021-03-23 NOTE — H&P (Signed)
Jonathon Bellows, MD [address], [place], [place], [place], [address] [address], [place], [place], [place], [address] Phone: [phone]  Fax: [phone]  Primary Care Physician:  Birdie Sons, MD   Pre-Procedure History & Physical: HPI:  Eddie Cabrera is a 65 y.o. male is here for an colonoscopy.   Past Medical History:  Diagnosis Date  . Depression   . History of chicken pox   . Wears contact lenses     Past Surgical History:  Procedure Laterality Date  . RHINOPLASTY     AGE 25 and again at age 44- history of deviated Septum    Prior to Admission medications   Medication Sig Start Date End Date Taking? Authorizing Provider  ALPRAZolam Duanne Moron) 1 MG tablet TAKE 1/4-1/2 TABLET DAILY AS NEEDED FOR ANXIETY OR SLEEP 09/09/20   Birdie Sons, MD  Multiple Vitamin (MULTIVITAMIN) tablet Take 1 tablet by mouth daily. Patient not taking: Reported on 03/18/2021    [provider]  sertraline (ZOLOFT) 100 MG tablet TAKE 1-2 TABLETS (100-200 MG TOTAL) BY MOUTH DAILY 01/03/21   Birdie Sons, MD    Allergies as of 03/18/2021  . (No Known Allergies)    Family History  Problem Relation Age of Onset  . Bladder Cancer Mother   . Kidney failure Father   . Colon cancer Neg Hx   . Prostate cancer Neg Hx     Social History   Socioeconomic History  . Marital status: Single    Spouse name: Not on file  . Number of children: 1  . Years of education: Not on file  . Highest education level: Not on file  Occupational History  . Not on file  Tobacco Use  . Smoking status: Never Smoker  . Smokeless tobacco: Never Used  Vaping Use  . Vaping Use: Never used  Substance and Sexual Activity  . Alcohol use: Yes    Alcohol/week: 7.0 standard drinks    Types: 7 Cans of beer per week    Comment: moderate use  . Drug use: No  . Sexual activity: Not on file  Other Topics Concern  . Not on file  Social History Narrative  . Not on file   Social Determinants  of Health   Financial Resource Strain: Not on file  Food Insecurity: Not on file  Transportation Needs: Not on file  Physical Activity: Not on file  Stress: Not on file  Social Connections: Not on file  Intimate Partner Violence: Not on file    Review of Systems: See HPI, otherwise negative ROS  Physical Exam: BP (!) 130/102   Pulse 80   Temp (!) 96.5 F (35.8 C) (Skin)   Resp 20   Ht 5\' 11"  (1.803 m)   Wt 85.3 kg   SpO2 98%   BMI 26.22 kg/m  General:   Alert,  pleasant and cooperative in NAD Head:  Normocephalic and atraumatic. Neck:  Supple; no masses or thyromegaly. Lungs:  Clear throughout to auscultation, normal respiratory effort.    Heart:  +S1, +S2, Regular rate and rhythm, No edema. Abdomen:  Soft, nontender and nondistended. Normal bowel sounds, without guarding, and without rebound.   Neurologic:  Alert and  oriented x4;  grossly normal neurologically.  Impression/Plan: Robbi Garter is here for an colonoscopy to be performed for positive cologuard test .  Risks, benefits, limitations, and alternatives regarding  colonoscopy have been reviewed with the patient.  Questions have been  answered.  All parties agreeable.   Jonathon Bellows, MD  03/23/2021, 8:01 AM

## 2021-03-23 NOTE — Transfer of Care (Signed)
Immediate Anesthesia Transfer of Care Note  Patient: Eddie Cabrera  Procedure(s) Performed: COLONOSCOPY WITH PROPOFOL (N/A )  Patient Location: Endoscopy Unit  Anesthesia Type:General  Level of Consciousness: drowsy  Airway & Oxygen Therapy: Patient Spontanous Breathing  Post-op Assessment: Report given to RN and Post -op Vital signs reviewed and stable  Post vital signs: Reviewed and stable  Last Vitals:  Vitals Value Taken Time  BP 88/68 03/23/21 0827  Temp 35.6 C 03/23/21 0826  Pulse 78 03/23/21 0827  Resp 16 03/23/21 0827  SpO2 93 % 03/23/21 0827  Vitals shown include unvalidated device data.  Last Pain:  Vitals:   03/23/21 0826  TempSrc: Temporal  PainSc: Asleep         Complications: No complications documented.

## 2021-03-24 ENCOUNTER — Encounter: Payer: Self-pay | Admitting: Gastroenterology

## 2021-03-24 ENCOUNTER — Encounter: Payer: Self-pay | Admitting: Urology

## 2021-03-30 ENCOUNTER — Other Ambulatory Visit: Payer: Self-pay | Admitting: Urology

## 2021-03-30 DIAGNOSIS — R935 Abnormal findings on diagnostic imaging of other abdominal regions, including retroperitoneum: Secondary | ICD-10-CM

## 2021-03-30 DIAGNOSIS — R972 Elevated prostate specific antigen [PSA]: Secondary | ICD-10-CM

## 2021-04-03 ENCOUNTER — Encounter: Payer: Self-pay | Admitting: Urology

## 2021-05-11 ENCOUNTER — Other Ambulatory Visit: Payer: Self-pay | Admitting: Urology

## 2021-05-13 ENCOUNTER — Encounter: Payer: Self-pay | Admitting: Urology

## 2021-05-13 ENCOUNTER — Telehealth (INDEPENDENT_AMBULATORY_CARE_PROVIDER_SITE_OTHER): Payer: 59 | Admitting: Urology

## 2021-05-13 ENCOUNTER — Other Ambulatory Visit: Payer: Self-pay

## 2021-05-13 DIAGNOSIS — C61 Malignant neoplasm of prostate: Secondary | ICD-10-CM | POA: Diagnosis not present

## 2021-05-13 NOTE — Progress Notes (Signed)
Virtual Visit via Telephone Note  I connected with Eddie Cabrera on 05/13/21 at  3:30 PM EDT by telephone and verified that I am speaking with the correct person using two identifiers.  Location: Patient: Office Provider: [facility] Participants: Patient and provider   I discussed the limitations, risks, security and privacy concerns of performing an evaluation and management service by telephone and the availability of in person appointments. I also discussed with the patient that there may be a patient responsible charge related to this service. The patient expressed understanding and agreed to proceed.   History of Present Illness: I contacted Mr. Shelton to discuss pathology results of his recent fusion biopsy.   Initial PSA 12/22/2020 was 11.9 with repeat PSAs in January and February 2022 of 6.9 and 6.4 respectively  He initially elected prostate MRI which showed PI-RADS 4 lesions x3  MR fusion biopsy performed at [facility] on 04/29/2021  He underwent 3 cores of each ROI as well as standard 12 core biopsy  No postbiopsy complaints  Prostate volume by ultrasound 56 g  Pathology: ROI biopsy from the left posterolateral/posteromedial PZ with 2/3 cores showing Gleason 3+3 adenocarcinoma involving 20% / 10%  The remaining ROI biopsies as well as template biopsies all showed benign prostate tissue   Observations/Objective: Alert, conversant  Assessment and Plan:  1.  T1c low risk prostate cancer  The pathology report was discussed in detail  I discussed active surveillance in detail including the recommendations of a confirmatory biopsy within 1-2 years of the initial diagnosis and would also recommend a repeat MRI prior to confirmatory biopsy  The recommendation of every 6 month visit or PSA and DRE annually was discussed  He elected active surveillance and did not desire to discuss curative treatment options of radical prostatectomy or radiation  modalities  Follow Up Instructions:  Schedule 6 month follow-up with PSA   I discussed the assessment and treatment plan with the patient. The patient was provided an opportunity to ask questions and all were answered. The patient agreed with the plan and demonstrated an understanding of the instructions.   The patient was advised to call back or seek an in-person evaluation if the symptoms worsen or if the condition fails to improve as anticipated.  I provided 25 minutes of non-face-to-face time during this encounter including previsit review and postvisit documentation.   Abbie Sons, MD

## 2021-05-25 ENCOUNTER — Other Ambulatory Visit: Payer: Self-pay | Admitting: Family Medicine

## 2021-05-25 DIAGNOSIS — G479 Sleep disorder, unspecified: Secondary | ICD-10-CM

## 2021-05-25 NOTE — Telephone Encounter (Signed)
Requested medication (s) are due for refill today: no  Requested medication (s) are on the active medication list: yes  Last refill: 09/09/2020  Future visit scheduled: no   Notes to clinic:  this refill cannot be delegated   Requested Prescriptions  Pending Prescriptions Disp Refills   ALPRAZolam (XANAX) 1 MG tablet [Pharmacy Med Name: ALPRAZOLAM 1 MG TABLET] 15 tablet     Sig: TAKE 1/4-1/2 TABLET DAILY AS NEEDED FOR ANXIETY OR SLEEP      Not Delegated - Psychiatry:  Anxiolytics/Hypnotics Failed - 05/25/2021 11:51 AM      Failed - This refill cannot be delegated      Failed - Urine Drug Screen completed in last 360 days      Passed - Valid encounter within last 6 months    Recent Outpatient Visits           5 months ago Annual physical exam   [facility] Birdie Sons, MD   1 year ago Sleep disturbance   [facility] Birdie Sons, MD   2 years ago Annual physical exam   [facility] Birdie Sons, MD   2 years ago Dupuytren's contracture of left hand   [facility] Birdie Sons, MD   3 years ago Annual physical exam   [facility] Birdie Sons, MD       Future Appointments             In 5 months [place], Ronda Fairly, [facility]

## 2021-08-12 ENCOUNTER — Telehealth: Payer: Self-pay | Admitting: Family Medicine

## 2021-08-12 DIAGNOSIS — F39 Unspecified mood [affective] disorder: Secondary | ICD-10-CM

## 2021-08-12 DIAGNOSIS — F419 Anxiety disorder, unspecified: Secondary | ICD-10-CM

## 2021-08-12 MED ORDER — SERTRALINE HCL 100 MG PO TABS: 100.0000 mg | ORAL_TABLET | Freq: Every day | ORAL | 1 refills | Status: DC

## 2021-08-12 NOTE — Telephone Encounter (Signed)
[facility] faxed refill request for the following medications:    sertraline (ZOLOFT) 100 MG tablet    Please advise.

## 2021-08-18 MED ORDER — SERTRALINE HCL 100 MG PO TABS: 100.0000 mg | ORAL_TABLET | Freq: Every day | ORAL | 1 refills | Status: DC

## 2021-08-18 NOTE — Telephone Encounter (Signed)
Prescription sent to [facility] order. I also called [facility] and cancelled the prescription that was sent in on 08/12/2021.

## 2021-08-18 NOTE — Addendum Note (Signed)
Addended by: Randal Buba on: 08/18/2021 02:18 PM   Modules accepted: Orders

## 2021-08-18 NOTE — Telephone Encounter (Signed)
It looks like this was sent to [facility].  Needs to be sent to [facility].

## 2021-10-12 ENCOUNTER — Ambulatory Visit: Payer: 59 | Admitting: Urology

## 2021-11-11 ENCOUNTER — Other Ambulatory Visit: Payer: Self-pay

## 2021-11-11 DIAGNOSIS — R972 Elevated prostate specific antigen [PSA]: Secondary | ICD-10-CM

## 2021-11-11 DIAGNOSIS — C61 Malignant neoplasm of prostate: Secondary | ICD-10-CM

## 2021-11-12 ENCOUNTER — Other Ambulatory Visit: Payer: 59

## 2021-11-12 ENCOUNTER — Other Ambulatory Visit: Payer: Self-pay

## 2021-11-12 DIAGNOSIS — C61 Malignant neoplasm of prostate: Secondary | ICD-10-CM

## 2021-11-12 DIAGNOSIS — R972 Elevated prostate specific antigen [PSA]: Secondary | ICD-10-CM

## 2021-11-13 LAB — PSA: Prostate Specific Ag, Serum: 7.2 ng/mL — ABNORMAL HIGH (ref 0.0–4.0)

## 2021-11-17 ENCOUNTER — Encounter: Payer: Self-pay | Admitting: Urology

## 2021-11-17 ENCOUNTER — Other Ambulatory Visit: Payer: Self-pay

## 2021-11-17 ENCOUNTER — Ambulatory Visit (INDEPENDENT_AMBULATORY_CARE_PROVIDER_SITE_OTHER): Payer: 59 | Admitting: Urology

## 2021-11-17 VITALS — BP 133/91 | HR 92 | Ht 71.0 in | Wt 185.0 lb

## 2021-11-17 DIAGNOSIS — R310 Gross hematuria: Secondary | ICD-10-CM | POA: Diagnosis not present

## 2021-11-17 DIAGNOSIS — R972 Elevated prostate specific antigen [PSA]: Secondary | ICD-10-CM

## 2021-11-17 NOTE — Progress Notes (Signed)
11/17/2021 11:27 AM   Eddie Cabrera 01/15/1956 [id number]  Referring provider: Birdie Sons, [place],  [place] [address]  Chief Complaint  Patient presents with   Prostate Cancer    Urologic history:  1.  T1c low risk prostate cancer PSA 11/2020 -11.9; repeat PSA January and February 2022 were 6.9 and 6.4 respectively MRI PI-RADS 4 lesions x3 MR fusion biopsy 04/29/2021 with 56 g prostate 2/3 ROI biopsies with Gleason 3+3 adenocarcinoma; template biopsies benign Elected active surveillance   HPI: 65 y.o. male presents for a 24-month follow-up.  3 weeks ago had onset of total gross painless hematuria which he states was heavy for approximately 1 day and intermittent x3 days Denied dysuria, voiding symptoms or flank/abdominal/pelvic pain No prior history hematuria PSA 11/12/2021 was stable at 7.2   PMH: Past Medical History:  Diagnosis Date   Depression    History of chicken pox    Wears contact lenses     Surgical History: Past Surgical History:  Procedure Laterality Date   COLONOSCOPY WITH PROPOFOL N/A 03/23/2021   Procedure: COLONOSCOPY WITH PROPOFOL;  Surgeon: Jonathon Bellows, MD;  [facility] ENDOSCOPY;  Service: Gastroenterology;  Laterality: N/A;   RHINOPLASTY     AGE 16 and again at age 22- history of deviated Septum    Home Medications:  Allergies as of 11/17/2021   No Known Allergies      Medication List        Accurate as of November 17, 2021 11:27 AM. If you have any questions, ask your nurse or doctor.          STOP taking these medications    multivitamin tablet Stopped by: Abbie Sons, MD       TAKE these medications    ALPRAZolam 1 MG tablet Commonly known as: XANAX TAKE 1/4-1/2 TABLET DAILY AS NEEDED FOR ANXIETY OR SLEEP   sertraline 100 MG tablet Commonly known as: ZOLOFT Take 1-2 tablets (100-200 mg total) by mouth daily.        Allergies: No Known Allergies  Family  History: Family History  Problem Relation Age of Onset   Bladder Cancer Mother    Kidney failure Father    Colon cancer Neg Hx    Prostate cancer Neg Hx     Social History:  reports that he has never smoked. He has never used smokeless tobacco. He reports current alcohol use of about 7.0 standard drinks per week. He reports that he does not use drugs.   Physical Exam: BP (!) 133/91   Pulse 92   Ht 5\' 11"  (1.803 m)   Wt 185 lb (83.9 kg)   BMI 25.80 kg/m   Constitutional:  Alert and oriented, No acute distress. HEENT: [place] AT, moist mucus membranes.  Trachea midline, no masses. Cardiovascular: No clubbing, cyanosis, or edema. Respiratory: Normal respiratory effort, no increased work of breathing. Psychiatric: Normal mood and affect.   Assessment & Plan:    1.  T1c low risk prostate cancer Stable PSA Desires to continue active surveillance 70-month follow-up for PSA/DRE  2.  Gross hematuria Recent episode total gross painless hematuria I discussed the recommended evaluation of gross hematuria which includes a CT urogram and cystoscopy for complete evaluation of the upper and lower urinary tract.  We discussed even an isolated episode of hematuria could be secondary to a urologic malignancy and have recommended scheduling the evaluation We discussed failure to proceed could result in delaying diagnosis of  urologic malignancy He wanted to think this over before deciding and indicated he would call back if he desires to pursue further evaluation   Abbie Sons, [place] [address] [place], [place] [address] [phone]

## 2021-11-29 ENCOUNTER — Encounter: Payer: Self-pay | Admitting: Urology

## 2021-11-29 ENCOUNTER — Other Ambulatory Visit: Payer: Self-pay | Admitting: Family Medicine

## 2021-11-29 DIAGNOSIS — R310 Gross hematuria: Secondary | ICD-10-CM

## 2021-12-14 ENCOUNTER — Ambulatory Visit: Payer: 59

## 2022-01-06 ENCOUNTER — Other Ambulatory Visit: Payer: Self-pay | Admitting: Family Medicine

## 2022-01-06 ENCOUNTER — Telehealth: Payer: Self-pay | Admitting: *Deleted

## 2022-01-06 DIAGNOSIS — F419 Anxiety disorder, unspecified: Secondary | ICD-10-CM

## 2022-01-06 DIAGNOSIS — F39 Unspecified mood [affective] disorder: Secondary | ICD-10-CM

## 2022-01-06 DIAGNOSIS — G479 Sleep disorder, unspecified: Secondary | ICD-10-CM

## 2022-01-06 NOTE — Telephone Encounter (Signed)
I called and made him an appt for yearly physical with Dr Caryn Section.   I also sent the refill requests for the sertraline and Xanax for a 3 month approval from Dr. Caryn Section.

## 2022-01-06 NOTE — Telephone Encounter (Signed)
Requested medication (s) are due for refill today:   No for sertraline but it will run out before his appt on 03/22/2022.  Provider to review the Xanax  Requested medication (s) are on the active medication list:   Yes for both  Future visit scheduled:   Yes CPE on 03/22/2022 with Dr. Caryn Section   Last ordered: Sertraline 08/18/2021 #180, 1 refill;    Xanax 05/25/2021 #15, 5 refills  Returned for provider to review for a 3 month fill per pt request so he won't run out before his appt in March.   Xanax is non delegated.     Requested Prescriptions  Pending Prescriptions Disp Refills   sertraline (ZOLOFT) 100 MG tablet [Pharmacy Med Name: SERTRALINE HCL 100 MG TABLET] 180 tablet 3    Sig: TAKE 1 TO 2 TABLETS BY MOUTH EVERY DAY     Psychiatry:  Antidepressants - SSRI Failed - 01/06/2022 10:06 AM      Failed - Completed PHQ-2 or PHQ-9 in the last 360 days      Failed - Valid encounter within last 6 months    Recent Outpatient Visits           1 year ago Annual physical exam   [facility] Birdie Sons, MD   1 year ago Sleep disturbance   [facility] Birdie Sons, MD   3 years ago Annual physical exam   [facility] Birdie Sons, MD   3 years ago Dupuytren's contracture of left hand   [facility] Birdie Sons, MD   4 years ago Annual physical exam   [facility] Birdie Sons, MD       Future Appointments             In 2 months Fisher, Kirstie Peri, MD [facility], [place]   In 4 months [place], Ronda Fairly, MD [facility]             ALPRAZolam Duanne Moron) 1 MG tablet [Pharmacy Med Name: ALPRAZOLAM 1 MG TABLET] 15 tablet     Sig: TAKE 1/4-1/2 TABLET DAILY AS NEEDED FOR ANXIETY OR SLEEP     Not Delegated - Psychiatry:  Anxiolytics/Hypnotics Failed - 01/06/2022 10:06 AM      Failed - This refill cannot be delegated      Failed - Urine Drug Screen completed in last 360  days      Failed - Valid encounter within last 6 months    Recent Outpatient Visits           1 year ago Annual physical exam   [facility] Birdie Sons, MD   1 year ago Sleep disturbance   [facility] Birdie Sons, MD   3 years ago Annual physical exam   [facility] Birdie Sons, MD   3 years ago Dupuytren's contracture of left hand   [facility] Birdie Sons, MD   4 years ago Annual physical exam   [facility] Birdie Sons, MD       Future Appointments             In 2 months Fisher, Kirstie Peri, MD [facility], [place]   In 4 months [place], Ronda Fairly, [facility]

## 2022-01-07 ENCOUNTER — Other Ambulatory Visit: Payer: Self-pay

## 2022-01-07 ENCOUNTER — Ambulatory Visit
Admission: RE | Admit: 2022-01-07 | Discharge: 2022-01-07 | Disposition: A | Discharge status: Home / Self Care | Payer: 59 | Source: Ambulatory Visit | Attending: Urology | Admitting: Urology

## 2022-01-07 DIAGNOSIS — R310 Gross hematuria: Secondary | ICD-10-CM | POA: Insufficient documentation

## 2022-01-07 LAB — POCT I-STAT CREATININE: Creatinine, Ser: 1.1 mg/dL (ref 0.61–1.24)

## 2022-01-07 MED ORDER — IOHEXOL 350 MG/ML SOLN
100.0000 mL | Freq: Once | INTRAVENOUS | Status: AC | PRN
  Administered 2022-01-07: 100 mL via INTRAVENOUS

## 2022-01-09 ENCOUNTER — Encounter: Payer: Self-pay | Admitting: Urology

## 2022-02-03 ENCOUNTER — Other Ambulatory Visit: Payer: Self-pay

## 2022-02-03 ENCOUNTER — Encounter: Payer: Self-pay | Admitting: Urology

## 2022-02-03 ENCOUNTER — Ambulatory Visit (INDEPENDENT_AMBULATORY_CARE_PROVIDER_SITE_OTHER): Payer: 59 | Admitting: Urology

## 2022-02-03 VITALS — BP 134/90 | HR 83 | Ht 71.0 in | Wt 185.0 lb

## 2022-02-03 DIAGNOSIS — N401 Enlarged prostate with lower urinary tract symptoms: Secondary | ICD-10-CM

## 2022-02-03 DIAGNOSIS — R310 Gross hematuria: Secondary | ICD-10-CM | POA: Diagnosis not present

## 2022-02-03 DIAGNOSIS — N4289 Other specified disorders of prostate: Secondary | ICD-10-CM | POA: Diagnosis not present

## 2022-02-03 LAB — URINALYSIS, COMPLETE
Bilirubin, UA: NEGATIVE
Glucose, UA: NEGATIVE
Ketones, UA: NEGATIVE
Leukocytes,UA: NEGATIVE
Nitrite, UA: NEGATIVE
RBC, UA: NEGATIVE
Specific Gravity, UA: >1.03 — ABNORMAL HIGH (ref 1.005–1.030)
Urobilinogen, Ur: 0.2 mg/dL (ref 0.2–1.0)
pH, UA: 5.5 (ref 5.0–7.5)

## 2022-02-03 LAB — MICROSCOPIC EXAMINATION
Bacteria, UA: NONE SEEN
Epithelial Cells (non renal): NONE SEEN /hpf (ref 0–10)

## 2022-02-03 NOTE — Progress Notes (Signed)
° °  02/03/22  CC:  Chief Complaint  Patient presents with   Cysto    HPI: Refer to my prior note 11/17/2021.  Denies recurrent gross hematuria.  CT urogram with a 2.6 cm right renal cyst, punctate nonobstructing right lower pole renal calculus and BPH  Blood pressure 134/90, pulse 83, height 5\' 11"  (1.803 m), weight 185 lb (83.9 kg). NED. A&Ox3.   No respiratory distress   Abd soft, NT, ND Normal phallus with bilateral descended testicles  Cystoscopy Procedure Note  Patient identification was confirmed, informed consent was obtained, and patient was prepped using Betadine solution.  Lidocaine jelly was administered per urethral meatus.     Pre-Procedure: - Inspection reveals a normal caliber urethral meatus.  Procedure: The flexible cystoscope was introduced without difficulty - No urethral strictures/lesions are present. -  Prominent lateral lobe enlargement  prostate with hypervascularity/friability and inflammatory changes -Mild elevation bladder neck - Bilateral ureteral orifices identified - Bladder mucosa  reveals no ulcers, tumors, or lesions - No bladder stones -Mild trabeculation  Retroflexion shows backbleeding from prostate.  No lesions noted   Post-Procedure: - Patient tolerated the procedure well  Assessment/ Plan: BPH with hypervascularity/friability which is the most likely etiology of his prior episode of gross hematuria Simple renal cyst and punctate renal calculus on CTU Deep scheduled follow-up appointment for prostate cancer active surveillance    Abbie Sons, MD

## 2022-02-06 ENCOUNTER — Encounter: Payer: Self-pay | Admitting: Urology

## 2022-02-07 LAB — CYTOLOGY - NON PAP

## 2022-02-08 ENCOUNTER — Encounter: Payer: Self-pay | Admitting: *Deleted

## 2022-02-08 ENCOUNTER — Telehealth: Payer: Self-pay | Admitting: *Deleted

## 2022-02-08 NOTE — Telephone Encounter (Signed)
Urine cytology showed no abnormal appearing cells.  Follow-up as scheduled

## 2022-03-22 ENCOUNTER — Encounter: Payer: Self-pay | Admitting: Family Medicine

## 2022-03-22 ENCOUNTER — Ambulatory Visit (INDEPENDENT_AMBULATORY_CARE_PROVIDER_SITE_OTHER): Payer: 59 | Admitting: Family Medicine

## 2022-03-22 ENCOUNTER — Other Ambulatory Visit: Payer: Self-pay

## 2022-03-22 VITALS — BP 135/95 | HR 77 | Temp 98.1°F | Resp 14 | Wt 193.0 lb

## 2022-03-22 DIAGNOSIS — G479 Sleep disorder, unspecified: Secondary | ICD-10-CM

## 2022-03-22 DIAGNOSIS — F39 Unspecified mood [affective] disorder: Secondary | ICD-10-CM | POA: Diagnosis not present

## 2022-03-22 DIAGNOSIS — R030 Elevated blood-pressure reading, without diagnosis of hypertension: Secondary | ICD-10-CM | POA: Diagnosis not present

## 2022-03-22 DIAGNOSIS — F419 Anxiety disorder, unspecified: Secondary | ICD-10-CM | POA: Diagnosis not present

## 2022-03-22 MED ORDER — SERTRALINE HCL 100 MG PO TABS: 100.0000 mg | ORAL_TABLET | Freq: Every day | ORAL | 2 refills | Status: DC

## 2022-03-22 MED ORDER — ALPRAZOLAM 1 MG PO TABS: ORAL_TABLET | ORAL | 1 refills | Status: DC

## 2022-03-22 NOTE — Patient Instructions (Addendum)
Please review the attached list of medications and notify my office if there are any errors.  ? ?Please bring all of your medications to every appointment so we can make sure that our medication list is the same as yours.  ? ?If you have heartburn more than 3 days a week, then you should take a daily maintenance acid reducer such as famotidine or omeprazole.  ? ?Avoid taking any more than '81mg'$  over aspirin a day. If you need something stronger, you can take Aleve or Ibuprofen which is less likely to cause indigestion ? ? ?

## 2022-03-22 NOTE — Progress Notes (Signed)
?  ? ?I,[name],acting as a scribe for [name], MD.,have documented all relevant documentation on the behalf of [name], MD,as directed by  [name], MD while in the presence of [name], MD.  ? ?Established patient visit ? ? ?Patient: Eddie Cabrera   DOB: 1956/06/28   66 y.o. Male  MRN: [id number] ?Visit Date: 03/22/2022 ? ?Today's healthcare provider: [name], MD  ? ?Chief Complaint  ?Patient presents with  ? Anxiety  ? Insomnia  ? ?Subjective  ?  ?HPI  ?Anxiety, Follow-up ? ?He was last seen for anxiety on 12/22/2020.   ?Changes made at last visit include none. Patient was doing very well on sertraline which he wished to continue. Reminded that if he decides to stop it to call for tapering schedule. ?  ?He reports good compliance with treatment. ?He reports good tolerance of treatment. ?He is not having side effects.  ? ?He feels his anxiety is moderate and Unchanged since last visit. ? ?Symptoms: ?No chest pain No difficulty concentrating  ?No dizziness No fatigue  ?No feelings of losing control No insomnia  ?No irritable No palpitations  ?No panic attacks No racing thoughts  ?No shortness of breath No sweating  ?No tremors/shakes   ? ?GAD-7 Results ? ?  03/22/2022  ?  3:26 PM 01/21/2020  ?  2:34 PM  ?GAD-7 Generalized Anxiety Disorder Screening Tool  ?1. Feeling Nervous, Anxious, or on Edge 0 0  ?2. Not Being Able to Stop or Control Worrying 0 0  ?3. Worrying Too Much About Different Things 1 0  ?4. Trouble Relaxing 1 0  ?5. Being So Restless it's Hard To Sit Still 0 0  ?6. Becoming Easily Annoyed or Irritable 0 0  ?7. Feeling Afraid As If Something Awful Might Happen 0 0  ?Total GAD-7 Score 2 0  ?Difficulty At Work, Home, or Getting  Along With Others? Not difficult at all Not difficult at all  ? ? ?PHQ-9 Scores ? ?  03/22/2022  ?  3:22 PM 03/22/2022  ?  3:21 PM 12/22/2020  ?  9:15 AM  ?PHQ9 SCORE ONLY  ?PHQ-9 Total Score 0 0 0   ? ? ?---------------------------------------------------------------------------------------------------  ?Follow up for insomnia: ? ?The patient was last seen for this 12/22/2020.   ?Changes made at last visit include none; was doing well with occasional alprazolam. ? ?He reports good compliance with treatment. ?He feels that condition is Unchanged. ?He is not having side effects.  ? ?-----------------------------------------------------------------------------------------  ? ?Medications: ?Outpatient Medications Prior to Visit  ?Medication Sig  ? ALPRAZolam (XANAX) 1 MG tablet TAKE 1/4-1/2 TABLET DAILY AS NEEDED FOR ANXIETY OR SLEEP  ? sertraline (ZOLOFT) 100 MG tablet TAKE 1 TO 2 TABLETS BY MOUTH EVERY DAY  ? ?No facility-administered medications prior to visit.  ? ? ?Review of Systems  ?Constitutional:  Negative for appetite change, chills and fever.  ?Respiratory:  Negative for chest tightness, shortness of breath and wheezing.   ?Cardiovascular:  Negative for chest pain and palpitations.  ?Gastrointestinal:  Negative for abdominal pain, nausea and vomiting.  ? ? ?  Objective  ?  ?BP (!) 135/95 (BP Location: Left Arm)   Pulse 77   Temp 98.1 ?F (36.7 ?C) (Oral)   Resp 14   Wt 193 lb (87.5 kg)   SpO2 96% Comment: room air  BMI 26.92 kg/m?  ? ? ?Today's Vitals  ? 03/22/22 1523 03/22/22 1528  ?BP: (!) 140/98 (!) 135/95  ?Pulse: 77   ?  Resp: 14   ?Temp: 98.1 ?F (36.7 ?C)   ?TempSrc: Oral   ?SpO2: 96%   ?Weight: 193 lb (87.5 kg)   ? ?Body mass index is 26.92 kg/m?.  ? ?Physical Exam  ? ?General appearance:  ?Well developed, well nourished male, cooperative and in no acute distress ?Head: Normocephalic, without obvious abnormality, atraumatic ?Respiratory: Respirations even and unlabored, normal respiratory rate ?Extremities: All extremities are intact.  ?Skin: Skin color, texture, turgor normal. No rashes seen  ?Psych: Appropriate mood and affect. ?Neurologic: Mental status: Alert, oriented to person, place,  and time, thought content appropriate.  ? ? Assessment & Plan  ?  ? ?1. Anxiety/Episodic mood disorder ([place]) ?He is doing very well with sertraline and wishes to continue as currently taking. refill sertraline (ZOLOFT) 100 MG tablet; Take 1-2 tablets (100-200 mg total) by mouth daily.  Dispense: 180 tablet; Refill: 2 ? ?2. Elevated blood pressure.  ? ? Encourage regular exercise, good sleep habits. He admits to consuming too much salt in diet and will try to eat healthier.  ? ?3. Sleep disturbance ?He rarely takes very low dose of alprazolam, but is running low on prescription. Refill ALPRAZolam (XANAX) 1 MG tablet; TAKE 1/4-1/2 TABLET DAILY AS NEEDED FOR ANXIETY OR SLEEP  Dispense: 15 tablet; Refill: 1  ?   ? ?The entirety of the information documented in the History of Present Illness, Review of Systems and Physical Exam were personally obtained by me. Portions of this information were initially documented by the CMA and reviewed by me for thoroughness and accuracy.   ? ? ?[name], MD  ?[facility] ?[phone] (phone) ?[phone] (fax) ? ?[facility]  ?

## 2022-03-23 ENCOUNTER — Encounter: Payer: Self-pay | Admitting: Family Medicine

## 2022-03-23 DIAGNOSIS — G479 Sleep disorder, unspecified: Secondary | ICD-10-CM

## 2022-03-24 MED ORDER — ALPRAZOLAM 1 MG PO TABS: ORAL_TABLET | ORAL | 1 refills | Status: DC

## 2022-04-08 ENCOUNTER — Other Ambulatory Visit: Payer: Self-pay | Admitting: Family Medicine

## 2022-04-08 DIAGNOSIS — F419 Anxiety disorder, unspecified: Secondary | ICD-10-CM

## 2022-04-08 DIAGNOSIS — F39 Unspecified mood [affective] disorder: Secondary | ICD-10-CM

## 2022-05-10 ENCOUNTER — Other Ambulatory Visit: Payer: 59

## 2022-05-10 DIAGNOSIS — R972 Elevated prostate specific antigen [PSA]: Secondary | ICD-10-CM

## 2022-05-11 LAB — PSA: Prostate Specific Ag, Serum: 7.3 ng/mL — ABNORMAL HIGH (ref 0.0–4.0)

## 2022-05-18 ENCOUNTER — Ambulatory Visit (INDEPENDENT_AMBULATORY_CARE_PROVIDER_SITE_OTHER): Payer: 59 | Admitting: Urology

## 2022-05-18 ENCOUNTER — Encounter: Payer: Self-pay | Admitting: Urology

## 2022-05-18 VITALS — BP 137/87 | HR 81 | Ht 71.0 in | Wt 188.0 lb

## 2022-05-18 DIAGNOSIS — R310 Gross hematuria: Secondary | ICD-10-CM

## 2022-05-18 DIAGNOSIS — C61 Malignant neoplasm of prostate: Secondary | ICD-10-CM | POA: Diagnosis not present

## 2022-05-18 NOTE — Progress Notes (Signed)
   05/18/2022 11:08 AM   Eddie Cabrera November 29, 1956 [id number]  Referring provider: Birdie Sons, [place],  [place] [address]  Chief Complaint  Patient presents with   Elevated PSA    Urologic history:  1.  T1c low risk prostate cancer PSA 11/2020 -11.9; repeat PSA January and February 2022 were 6.9 and 6.4 respectively MRI PI-RADS 4 lesions x3 MR fusion biopsy 04/29/2021 with 56 g prostate 2/3 ROI biopsies with Gleason 3+3 adenocarcinoma; template biopsies benign Elected active surveillance  2.  Gross hematuria Episode total gross painless hematuria November 2022 CTU 12/2021: 2.6 cm right renal cyst, punctate nonobstructing right lower pole calculus, BPH Cystoscopy 01/2022: Prominent lateral lobe enlargement with friable hypervascularity Urine cytology negative   HPI: 66 y.o. male presents for a 6 month follow-up.  Doing well since last visit No bothersome LUTS Denies dysuria, gross hematuria Denies flank, abdominal or pelvic pain PSA 05/10/2022 stable 7.3   PMH: Past Medical History:  Diagnosis Date   Depression    History of chicken pox    Prostate CA 03/09/2021   Wears contact lenses     Surgical History: Past Surgical History:  Procedure Laterality Date   COLONOSCOPY WITH PROPOFOL N/A 03/23/2021   Procedure: COLONOSCOPY WITH PROPOFOL;  Surgeon: Jonathon Bellows, MD;  Location: [facility] ENDOSCOPY;  Service: Gastroenterology;  Laterality: N/A;   RHINOPLASTY     AGE 15 and again at age 39- history of deviated Septum    Home Medications:  Allergies as of 05/18/2022   No Known Allergies      Medication List        Accurate as of May 18, 2022 11:08 AM. If you have any questions, ask your nurse or doctor.          ALPRAZolam 1 MG tablet Commonly known as: XANAX TAKE 1/4-1/2 TABLET DAILY AS NEEDED FOR ANXIETY OR SLEEP   sertraline 100 MG tablet Commonly known as: ZOLOFT TAKE 1 TO 2 TABLETS BY MOUTH EVERY DAY         Allergies: No Known Allergies  Family History: Family History  Problem Relation Age of Onset   Bladder Cancer Mother    Kidney failure Father    Colon cancer Neg Hx    Prostate cancer Neg Hx     Social History:  reports that he has never smoked. He has never used smokeless tobacco. He reports current alcohol use of about 7.0 standard drinks per week. He reports that he does not use drugs.   Physical Exam: BP 137/87   Pulse 81   Ht '5\' 11"'$  (1.803 m)   Wt 188 lb (85.3 kg)   BMI 26.22 kg/m   Constitutional:  Alert and oriented, No acute distress. HEENT: [place] AT, moist mucus membranes.  Trachea midline, no masses. Cardiovascular: No clubbing, cyanosis, or edema. Respiratory: Normal respiratory effort, no increased work of breathing. GU: Prostate 50 g, smooth without nodules Psychiatric: Normal mood and affect.   Assessment & Plan:    1.  T1c low risk prostate cancer Stable PSA Desires to continue active surveillance Discussed recommendation of a confirmatory biopsy within 2 years of his initial diagnosis.  Since cancer diagnosis was ROI lesions during fusion biopsy have recommended a 78-monthfollow-up with repeat MRI and will further discuss confirmatory biopsy at that time.   SAbbie Sons [address] [place]  [phone]

## 2022-06-23 ENCOUNTER — Encounter: Payer: Self-pay | Admitting: Urology

## 2022-07-04 ENCOUNTER — Ambulatory Visit
Admission: RE | Admit: 2022-07-04 | Discharge: 2022-07-04 | Disposition: A | Discharge status: Home / Self Care | Payer: 59 | Source: Ambulatory Visit | Attending: Urology | Admitting: Urology

## 2022-07-04 DIAGNOSIS — C61 Malignant neoplasm of prostate: Secondary | ICD-10-CM | POA: Insufficient documentation

## 2022-07-04 MED ORDER — GADOBUTROL 1 MMOL/ML IV SOLN
9.0000 mL | Freq: Once | INTRAVENOUS | Status: AC | PRN
  Administered 2022-07-04: 9 mL via INTRAVENOUS

## 2022-07-08 ENCOUNTER — Encounter: Payer: Self-pay | Admitting: Urology

## 2022-07-26 ENCOUNTER — Encounter: Payer: Self-pay | Admitting: *Deleted

## 2022-07-28 ENCOUNTER — Other Ambulatory Visit: Payer: Self-pay | Admitting: Family Medicine

## 2022-07-28 DIAGNOSIS — F419 Anxiety disorder, unspecified: Secondary | ICD-10-CM

## 2022-07-28 DIAGNOSIS — F39 Unspecified mood [affective] disorder: Secondary | ICD-10-CM

## 2022-08-01 ENCOUNTER — Other Ambulatory Visit: Payer: Self-pay | Admitting: Family Medicine

## 2022-08-01 DIAGNOSIS — G479 Sleep disorder, unspecified: Secondary | ICD-10-CM

## 2022-08-01 NOTE — Telephone Encounter (Signed)
LOV 03-22-22 NOV none  LR 03-24-22 #15 with 1 refill

## 2022-08-17 ENCOUNTER — Encounter: Payer: Self-pay | Admitting: Urology

## 2022-08-17 ENCOUNTER — Ambulatory Visit (INDEPENDENT_AMBULATORY_CARE_PROVIDER_SITE_OTHER): Payer: 59 | Admitting: Urology

## 2022-08-17 VITALS — BP 138/98 | HR 79 | Ht 71.0 in | Wt 194.0 lb

## 2022-08-17 DIAGNOSIS — C61 Malignant neoplasm of prostate: Secondary | ICD-10-CM | POA: Diagnosis not present

## 2022-08-17 MED ORDER — GENTAMICIN SULFATE 40 MG/ML IJ SOLN
80.0000 mg | Freq: Once | INTRAMUSCULAR | Status: AC
  Administered 2022-08-17: 80 mg via INTRAMUSCULAR

## 2022-08-17 MED ORDER — LEVOFLOXACIN 500 MG PO TABS
500.0000 mg | ORAL_TABLET | Freq: Once | ORAL | Status: AC
  Administered 2022-08-17: 500 mg via ORAL

## 2022-08-17 NOTE — Progress Notes (Signed)
Prostate Biopsy Procedure   Informed consent was obtained after discussing risks/benefits of the procedure.  A time out was performed to ensure correct patient identity.  Pre-Procedure: - 66 y.o. male with low risk prostate cancer on active surveillance presents for confirmatory biopsy -He elected not to proceed with repeat fusion biopsy and cognitive biopsy to be performed - Gentamicin given prophylactically - Levaquin 500 mg administered PO -Transrectal Ultrasound performed revealing a 84 gm prostate -No significant hypoechoic or median lobe noted  Procedure: - Prostate block performed using 10 cc 1% lidocaine and biopsies taken from sextant areas, a total of 12 under ultrasound guidance.  Right and left template cores included regions of MRI abnormalities  Post-Procedure: - Patient tolerated the procedure well - He was counseled to seek immediate medical attention if experiences any severe pain, significant bleeding, or fevers -Will call with biopsy results   Felice Giovanni, MD

## 2022-08-19 LAB — SURGICAL PATHOLOGY

## 2022-08-21 ENCOUNTER — Encounter: Payer: Self-pay | Admitting: Urology

## 2022-08-24 ENCOUNTER — Ambulatory Visit: Payer: 59 | Admitting: Urology

## 2022-11-15 ENCOUNTER — Other Ambulatory Visit: Payer: 59

## 2022-11-21 ENCOUNTER — Ambulatory Visit: Payer: 59 | Admitting: Urology

## 2023-02-16 ENCOUNTER — Other Ambulatory Visit: Payer: Self-pay | Admitting: Family Medicine

## 2023-02-16 DIAGNOSIS — F419 Anxiety disorder, unspecified: Secondary | ICD-10-CM

## 2023-02-16 DIAGNOSIS — F39 Unspecified mood [affective] disorder: Secondary | ICD-10-CM

## 2023-02-16 NOTE — Telephone Encounter (Signed)
Patient needs an OV, will refill for 30 days until OV can be made.  Requested Prescriptions  Pending Prescriptions Disp Refills   sertraline (ZOLOFT) 100 MG tablet [Pharmacy Med Name: Sertraline Hydrochloride 1103m Tablet] 60 tablet 0    Sig: Take 1 to 2 tablets by mouth daily.     Psychiatry:  Antidepressants - SSRI - sertraline Failed - 02/16/2023  1:30 PM      Failed - AST in normal range and within 360 days    AST  Date Value Ref Range Status  12/22/2020 16 0 - 40 IU/L Final         Failed - ALT in normal range and within 360 days    ALT  Date Value Ref Range Status  12/22/2020 17 0 - 44 IU/L Final         Failed - Valid encounter within last 6 months    Recent Outpatient Visits           11 months ago [facility]   2 years [facility]   3 years ago [facility]   4 years ago [facility]   4 years ago Dupuytren's contracture of left hand   [place]       Future Appointments             In 3 weeks Stoioff, SRonda Fairly MD [place]- Completed PHQ-2 or PHQ-9 in the last 360 days

## 2023-02-22 ENCOUNTER — Other Ambulatory Visit: Payer: 59

## 2023-02-22 ENCOUNTER — Other Ambulatory Visit: Payer: Self-pay

## 2023-02-22 DIAGNOSIS — R972 Elevated prostate specific antigen [PSA]: Secondary | ICD-10-CM

## 2023-02-22 DIAGNOSIS — C61 Malignant neoplasm of prostate: Secondary | ICD-10-CM

## 2023-02-23 ENCOUNTER — Encounter: Payer: Self-pay | Admitting: Urology

## 2023-02-23 LAB — PSA: Prostate Specific Ag, Serum: 5.4 ng/mL — ABNORMAL HIGH (ref 0.0–4.0)

## 2023-02-24 ENCOUNTER — Ambulatory Visit: Payer: 59 | Admitting: Urology

## 2023-03-15 ENCOUNTER — Ambulatory Visit: Payer: 59 | Admitting: Urology

## 2023-03-18 ENCOUNTER — Other Ambulatory Visit: Payer: Self-pay | Admitting: Family Medicine

## 2023-03-18 DIAGNOSIS — F39 Unspecified mood [affective] disorder: Secondary | ICD-10-CM

## 2023-03-18 DIAGNOSIS — F419 Anxiety disorder, unspecified: Secondary | ICD-10-CM

## 2023-03-20 ENCOUNTER — Telehealth: Payer: Self-pay | Admitting: Family Medicine

## 2023-03-20 DIAGNOSIS — G479 Sleep disorder, unspecified: Secondary | ICD-10-CM

## 2023-03-20 NOTE — Telephone Encounter (Signed)
Requested medications are due for refill today.  unsure  Requested medications are on the active medications list.  yes  Last refill. 02/16/2023 #60 0 rf  Future visit scheduled.   no  Notes to clinic.   Labs are expired. Pt needs an OV.    Requested Prescriptions  Pending Prescriptions Disp Refills   sertraline (ZOLOFT) 100 MG tablet [Pharmacy Med Name: Sertraline Hydrochloride 100mg  Tablet] 60 tablet 0    Sig: Take 1 to 2 tablets by mouth daily.     Psychiatry:  Antidepressants - SSRI - sertraline Failed - 03/18/2023 12:28 PM      Failed - AST in normal range and within 360 days    AST  Date Value Ref Range Status  12/22/2020 16 0 - 40 IU/L Final         Failed - ALT in normal range and within 360 days    ALT  Date Value Ref Range Status  12/22/2020 17 0 - 44 IU/L Final         Failed - Completed PHQ-2 or PHQ-9 in the last 360 days      Failed - Valid encounter within last 6 months    Recent Outpatient Visits           12 months ago Elevated blood pressure reading   [facility] Birdie Sons, MD   2 years ago Annual physical exam   [facility] Birdie Sons, MD   3 years ago Sleep disturbance   [facility] Birdie Sons, MD   4 years ago Annual physical exam   [facility] Birdie Sons, MD   4 years ago Dupuytren's contracture of left hand   [place], Donald E, MD       Future Appointments             In 5 months Stoioff, Ronda Fairly, MD [place]

## 2023-03-22 NOTE — Telephone Encounter (Signed)
Not seen in over a year. Need to schedule o.v. before refill can be approved.

## 2023-03-22 NOTE — Telephone Encounter (Signed)
Pt not seen in over a year, needs to schedule o.v. before prescription can be approved.

## 2023-03-23 NOTE — Telephone Encounter (Signed)
Patient called, left VM to return the call. OV is needed before approving alprazolam per Dr. Caryn Section. When he returns the call inform him of this and schedule if he agrees.

## 2023-03-24 ENCOUNTER — Ambulatory Visit: Payer: 59 | Admitting: Family Medicine

## 2023-03-24 ENCOUNTER — Other Ambulatory Visit: Payer: Self-pay | Admitting: Family Medicine

## 2023-03-24 VITALS — BP 134/100 | HR 71 | Ht 71.0 in | Wt 192.7 lb

## 2023-03-24 DIAGNOSIS — R030 Elevated blood-pressure reading, without diagnosis of hypertension: Secondary | ICD-10-CM | POA: Diagnosis not present

## 2023-03-24 DIAGNOSIS — F39 Unspecified mood [affective] disorder: Secondary | ICD-10-CM | POA: Diagnosis not present

## 2023-03-24 DIAGNOSIS — F419 Anxiety disorder, unspecified: Secondary | ICD-10-CM

## 2023-03-24 DIAGNOSIS — G479 Sleep disorder, unspecified: Secondary | ICD-10-CM | POA: Diagnosis not present

## 2023-03-24 MED ORDER — ALPRAZOLAM 1 MG PO TABS: ORAL_TABLET | ORAL | 1 refills | Status: DC

## 2023-03-24 NOTE — Progress Notes (Signed)
I,Sha'taria Tyson,acting as a Education administrator for [name], MD.,have documented all relevant documentation on the behalf of [name], MD,as directed by  [name], MD while in the presence of [name], MD.   Established patient visit   Patient: Eddie Cabrera   DOB: 12-18-1956   67 y.o. Male  MRN: [id number] Visit Date: 03/24/2023  Today's healthcare provider: [name], MD    Subjective    HPI  -Influenza Vaccine: did not receive Anxiety, Follow-up  He was last seen for anxiety 1 years ago. Changes made at last visit include continue current treatment.   He reports excellent compliance with treatment. He reports excellent tolerance of treatment. He is not having side effects.  He feels his anxiety is mild and Unchanged since last visit.  Symptoms: No chest pain No difficulty concentrating  No dizziness Yes fatigue  No feelings of losing control Yes insomnia  No irritable No palpitations  No panic attacks No racing thoughts  No shortness of breath No sweating  No tremors/shakes    GAD-7 Results    03/22/2022    3:26 PM 01/21/2020    2:34 PM  GAD-7 Generalized Anxiety Disorder Screening Tool  1. Feeling Nervous, Anxious, or on Edge 0 0  2. Not Being Able to Stop or Control Worrying 0 0  3. Worrying Too Much About Different Things 1 0  4. Trouble Relaxing 1 0  5. Being So Restless it's Hard To Sit Still 0 0  6. Becoming Easily Annoyed or Irritable 0 0  7. Feeling Afraid As If Something Awful Might Happen 0 0  Total GAD-7 Score 2 0  Difficulty At Work, Home, or Getting  Along With Others? Not difficult at all Not difficult at all    PHQ-9 Scores    03/22/2022    3:22 PM 03/22/2022    3:21 PM 12/22/2020    9:15 AM  PHQ9 SCORE ONLY  PHQ-9 Total Score 0 0 0    ---------------------------------------------------------------------------------------------------   Medications: Outpatient Medications Prior to Visit  Medication Sig   ALPRAZolam  (XANAX) 1 MG tablet Take 1/4 to 1/2 tablet by mouth daily as needed for anxiety or sleep.   sertraline (ZOLOFT) 100 MG tablet Take 1 to 2 tablets by mouth daily.   No facility-administered medications prior to visit.    Review of Systems  Constitutional:  Negative for appetite change, chills and fever.  Respiratory:  Negative for chest tightness, shortness of breath and wheezing.   Cardiovascular:  Negative for chest pain and palpitations.  Gastrointestinal:  Negative for abdominal pain, nausea and vomiting.       Objective    BP (!) 134/100 (BP Location: Right Arm, Patient Position: Sitting, Cuff Size: Normal)   Pulse 71   Ht 5\' 11"  (1.803 m)   Wt 192 lb 11.2 oz (87.4 kg)   SpO2 98%   BMI 26.88 kg/m    Physical Exam  General appearance: Well developed, well nourished male, cooperative and in no acute distress Head: Normocephalic, without obvious abnormality, atraumatic Respiratory: Respirations even and unlabored, normal respiratory rate Extremities: All extremities are intact.  Skin: Skin color, texture, turgor normal. No rashes seen  Psych: Appropriate mood and affect. Neurologic: Mental status: Alert, oriented to person, place, and time, thought content appropriate.   Assessment & Plan     1. Episodic mood disorder ([place])   2. Anxiety He feels sertraline continues to be very beneficial with no adverse effects, taking either 1  or 2 tablets a day depending on his mood.   3. Sleep disturbance Does well with occasional  ALPRAZolam (XANAX) 1 MG tablet; Take 1/4 to 1/2 tablet by mouth daily as needed for anxiety or sleep.  Dispense: 30 tablet; Refill: 1  4. Elevated blood pressure reading He admits to consuming too much salt and not exercising. Advised he will need BP medications if it doesn't come down. He' going to work on lifestyle modification and is to return in about 6 months for blood pressure check.         The entirety of the information documented in the  History of Present Illness, Review of Systems and Physical Exam were personally obtained by me. Portions of this information were initially documented by the CMA and reviewed by me for thoroughness and accuracy.     [name], MD  [place] [phone] (phone) [phone] (fax)  [place]

## 2023-03-24 NOTE — Patient Instructions (Signed)
.   Please review the attached list of medications and notify my office if there are any errors.   . Please bring all of your medications to every appointment so we can make sure that our medication list is the same as yours.   

## 2023-03-24 NOTE — Telephone Encounter (Signed)
Patient has appointment today

## 2023-03-24 NOTE — Telephone Encounter (Signed)
Called and left VM to call back to schedule OV.

## 2023-03-27 MED ORDER — SERTRALINE HCL 100 MG PO TABS: 100.0000 mg | ORAL_TABLET | Freq: Every day | ORAL | 3 refills | Status: DC

## 2023-03-27 NOTE — Telephone Encounter (Signed)
Requested Prescriptions  Pending Prescriptions Disp Refills   sertraline (ZOLOFT) 100 MG tablet [Pharmacy Med Name: Sertraline Hydrochloride 100mg  Tablet] 60 tablet 0    Sig: Take 1 to 2 tablets by mouth daily. Appointment is needed for further refills.     Psychiatry:  Antidepressants - SSRI - sertraline Failed - 03/24/2023  7:20 PM      Failed - AST in normal range and within 360 days    AST  Date Value Ref Range Status  12/22/2020 16 0 - 40 IU/L Final         Failed - ALT in normal range and within 360 days    ALT  Date Value Ref Range Status  12/22/2020 17 0 - 44 IU/L Final         Passed - Completed PHQ-2 or PHQ-9 in the last 360 days      Passed - Valid encounter within last 6 months    Recent Outpatient Visits           3 days ago Episodic mood disorder [facility])   [place], Donald E, MD   1 year ago Elevated blood pressure reading   [facility] Birdie Sons, MD   2 years ago Annual physical exam   [facility] Birdie Sons, MD   3 years ago Sleep disturbance   [facility] Birdie Sons, MD   4 years ago Annual physical exam   [facility] Birdie Sons, MD       Future Appointments             In 5 months Stoioff, Ronda Fairly, MD [place]   In 5 months Fisher, Kirstie Peri, MD [facility]

## 2023-03-27 NOTE — Addendum Note (Signed)
Addended by: Birdie Sons on: 03/27/2023 08:18 AM   Modules accepted: Orders

## 2023-06-14 ENCOUNTER — Other Ambulatory Visit: Payer: Self-pay | Admitting: Family Medicine

## 2023-06-14 DIAGNOSIS — F39 Unspecified mood [affective] disorder: Secondary | ICD-10-CM

## 2023-06-14 DIAGNOSIS — F419 Anxiety disorder, unspecified: Secondary | ICD-10-CM

## 2023-06-14 MED ORDER — SERTRALINE HCL 100 MG PO TABS: 100.0000 mg | ORAL_TABLET | Freq: Every day | ORAL | 2 refills | Status: DC

## 2023-07-18 IMAGING — CT CT ABD-PEL WO/W CM
3 of 12 series · 11 of 46 positions shown, 17 images · IV contrast (agent unspecified)
Comparison: None.

CLINICAL DATA: Intermittent gross hematuria.

EXAM:
CT ABDOMEN AND PELVIS WITHOUT AND WITH CONTRAST
TECHNIQUE: Multidetector CT imaging of the abdomen and pelvis was performed
following the standard protocol before and following the bolus
administration of intravenous contrast.

[Series 5: cor without without pre 2.00 cor · coronal · non-contrast · 0.74mm/px · 2 of 149 slices shown, 3 images]
[im 50/149  soft-tissue]
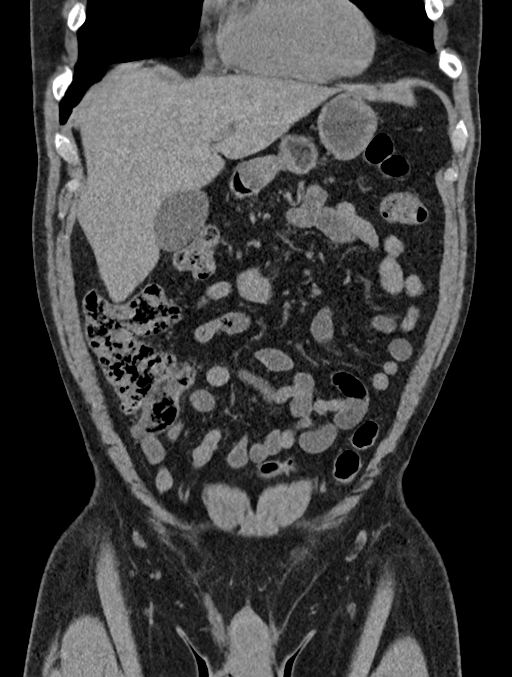
[im 50/149  bone]
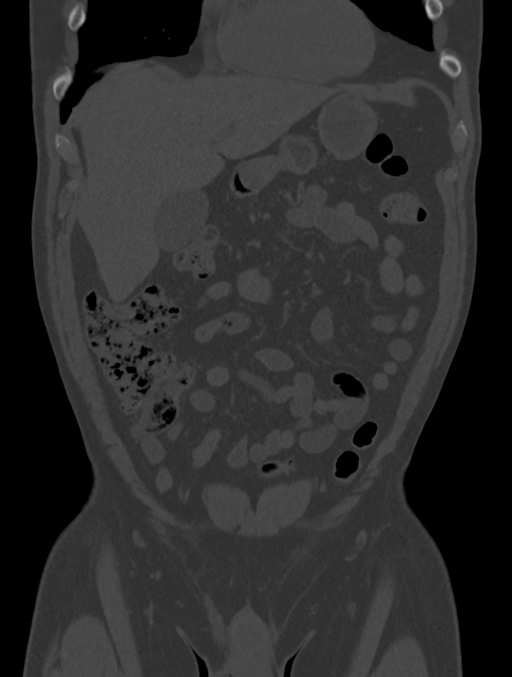
[im 99/149  soft-tissue]
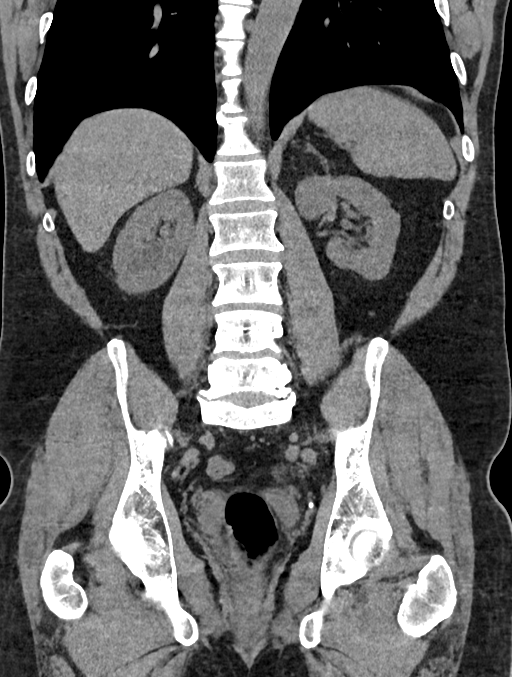

[Series 9: axial with hematuria with 5.00 · axial · 0.74mm/px · z∈[-1535,-1185]mm · 6 of 99 slices shown, 11 images]
[im 15/99  soft-tissue]
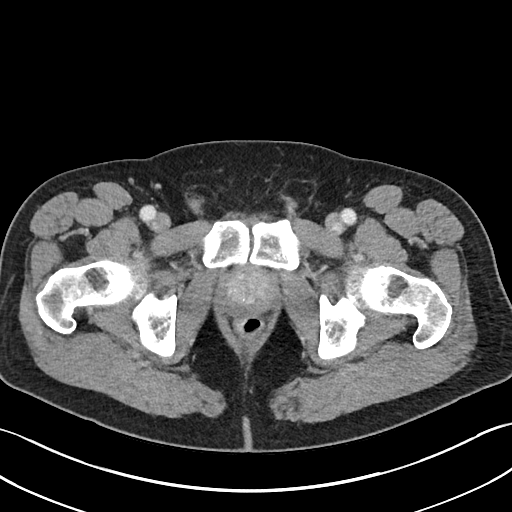
[im 15/99  bone]
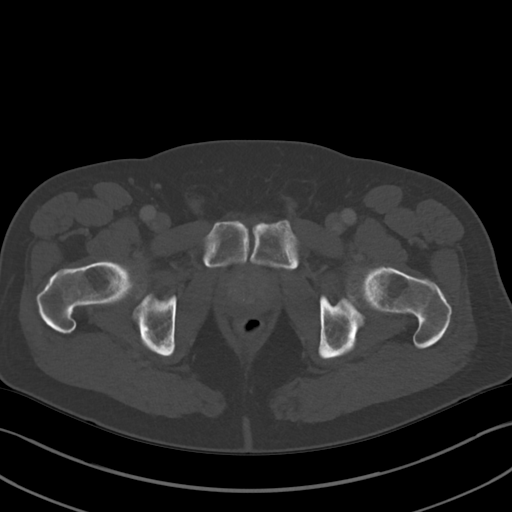
[im 29/99  soft-tissue]
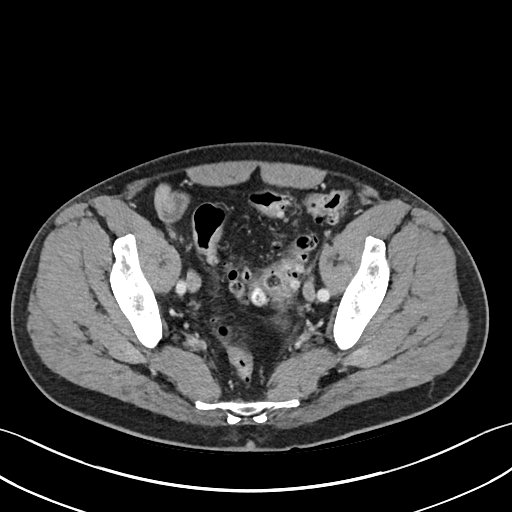
[im 43/99  soft-tissue]
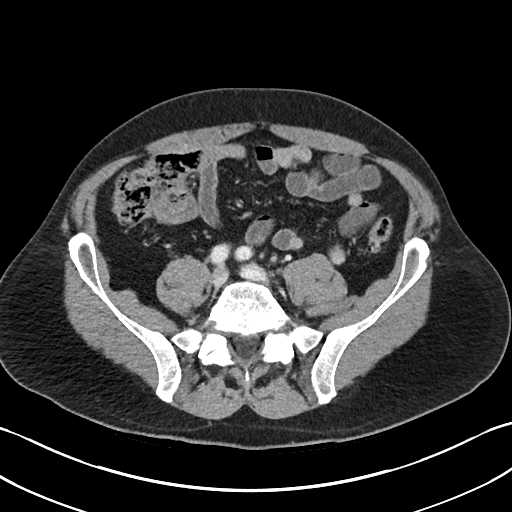
[im 43/99  lung]
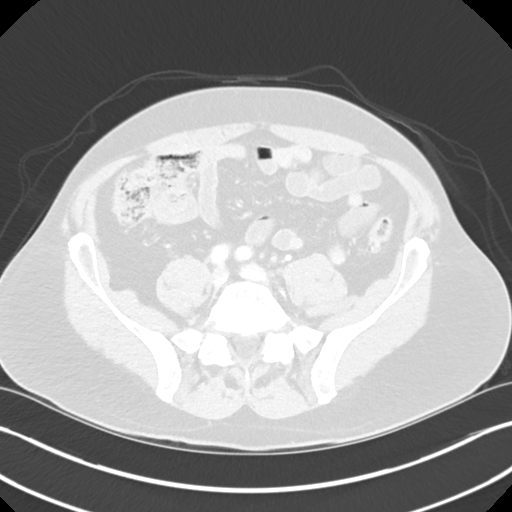
[im 57/99  soft-tissue]
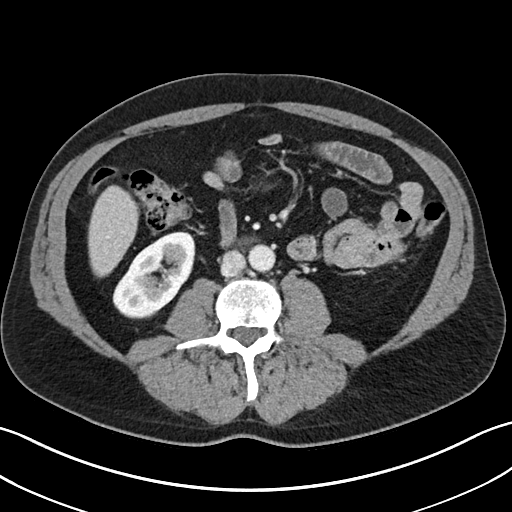
[im 57/99  lung]
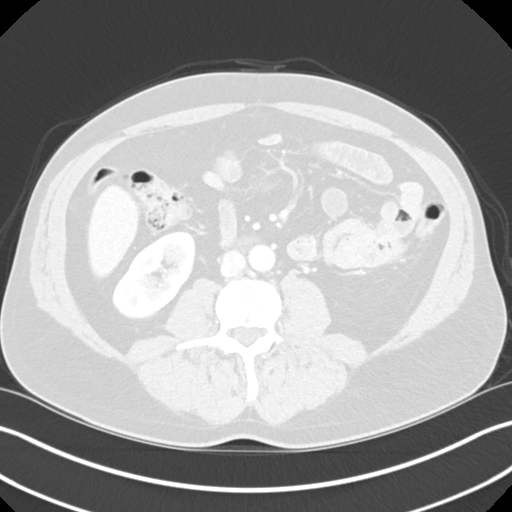
[im 71/99  soft-tissue]
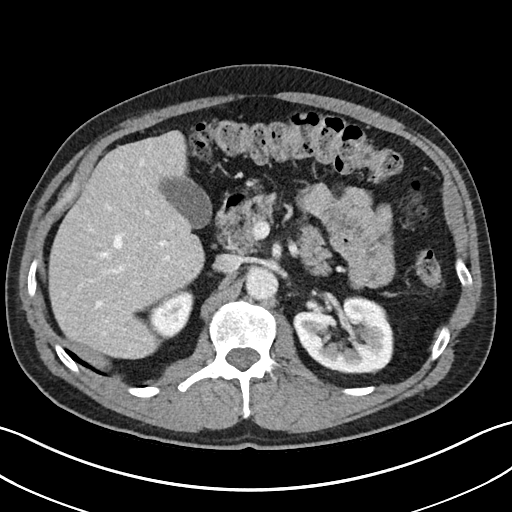
[im 71/99  lung]
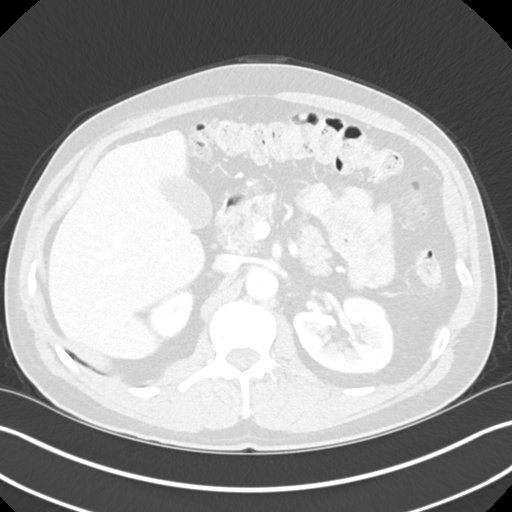
[im 85/99  soft-tissue]
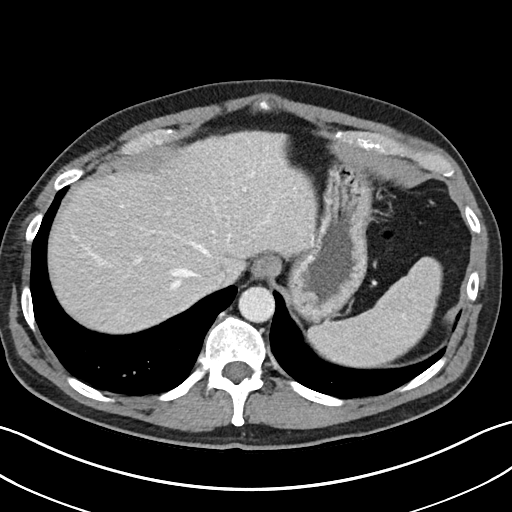
[im 85/99  lung]
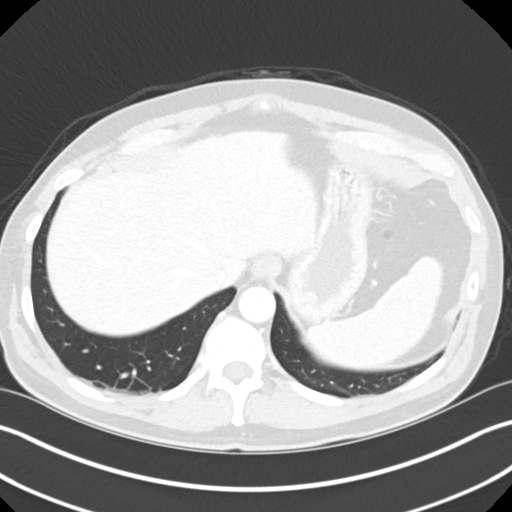

[Series 17: axial delay delay prone 5.00 · axial · delayed · 0.74mm/px · z∈[-1434,-1294]mm · 3 of 99 slices shown]
[im 15/99  soft-tissue]
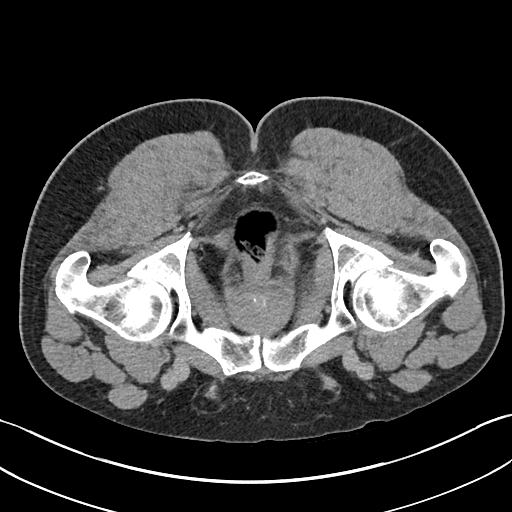
[im 29/99  soft-tissue]
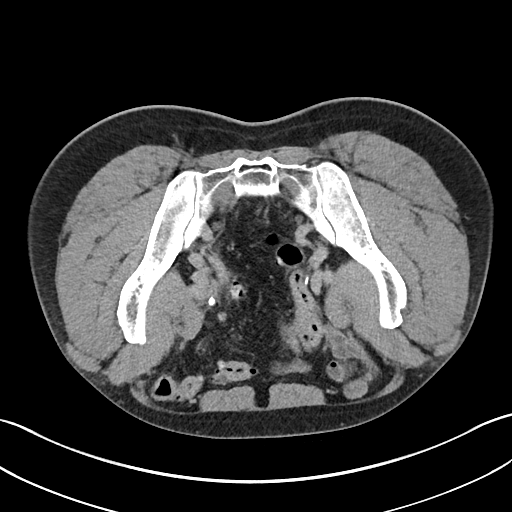
[im 43/99  soft-tissue]
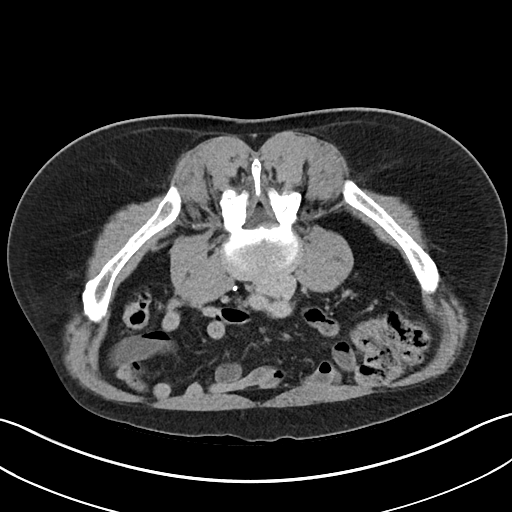

[11 of 46 positions shown; findings below may reference images not displayed]

RADIATION DOSE REDUCTION: This exam was performed according to the
departmental dose-optimization program which includes automated
exposure control, adjustment of the mA and/or kV according to
patient size and/or use of iterative reconstruction technique.

CONTRAST:  100mL OMNIPAQUE IOHEXOL 350 MG/ML SOLN
FINDINGS: Lower chest: No acute abnormality.

Hepatobiliary: Hypodense 6 mm lesion in the inferior aspect of the
right lobe of the liver is too small to accurately characterize but
statistically likely to reflect a benign entity. No solid enhancing
hepatic lesion. Gallbladder is unremarkable. No biliary ductal
dilation.

Pancreas: No pancreatic ductal dilation or evidence of acute
inflammation.

Spleen: Normal in size without focal abnormality.

Adrenals/Urinary Tract: Bilateral adrenal glands appear normal.

No hydronephrosis. Punctate nonobstructive right lower pole renal
stone. No obstructive ureteral or bladder calculi identified.

Hypodense 2.6 cm right renal cyst. Subcentimeter hypodense bilateral
renal lesions are technically too small to accurately characterize
but statistically likely reflect cysts. No solid enhancing renal
mass.

Kidneys demonstrate symmetric enhancement and excretion of contrast
material. No suspicious filling defect visualized within the
opacified portions of the collecting systems or ureters on delayed
imaging.

Effacement of the urinary bladder by the prostate gland. Mild
symmetric wall thickening of the incompletely distended urinary
bladder. No asymmetric wall thickening or suspicious intraluminal
filling defect identified.

Stomach/Bowel: Stomach is within normal limits. Appendix appears
normal. No evidence of bowel wall thickening, distention, or
inflammatory changes. Extensive sigmoid colonic diverticulosis
without findings of acute diverticulitis.

Vascular/Lymphatic: No significant vascular findings are present. No
enlarged abdominal or pelvic lymph nodes.

Reproductive: Heterogeneous enhancement of an enlarged prostate
gland.

Other: No significant abdominopelvic free fluid. Small fat
containing left inguinal hernia.

Musculoskeletal: Multilevel degenerative changes spine. No
aggressive lytic or blastic lesion of bone. No acute osseous
abnormality.
IMPRESSION: 1. Punctate nonobstructive right lower pole renal stone. No
obstructive ureteral or bladder calculi identified.
2. No solid enhancing renal mass.
3. Mild symmetric wall thickening of the incompletely distended
urinary bladder, which may be related to chronic outflow impedance.
4. Extensive sigmoid colonic diverticulosis without findings of
acute diverticulitis.

## 2023-08-29 ENCOUNTER — Other Ambulatory Visit: Payer: Self-pay | Admitting: *Deleted

## 2023-08-29 DIAGNOSIS — R972 Elevated prostate specific antigen [PSA]: Secondary | ICD-10-CM

## 2023-08-31 ENCOUNTER — Other Ambulatory Visit: Payer: 59

## 2023-08-31 DIAGNOSIS — R972 Elevated prostate specific antigen [PSA]: Secondary | ICD-10-CM

## 2023-09-01 LAB — PSA: Prostate Specific Ag, Serum: 4.7 ng/mL — ABNORMAL HIGH (ref 0.0–4.0)

## 2023-09-05 ENCOUNTER — Telehealth: Payer: Self-pay | Admitting: Urology

## 2023-09-05 NOTE — Telephone Encounter (Signed)
Yes, patient needs to be seen once a year.

## 2023-09-05 NOTE — Telephone Encounter (Signed)
Patient called and stated that he saw his lab results and wants to know if his appointment with Dr. Lonna Cobb on 09/06/23 is necessary. Please advise patient.

## 2023-09-06 ENCOUNTER — Ambulatory Visit: Payer: 59 | Admitting: Urology

## 2023-09-06 ENCOUNTER — Encounter: Payer: Self-pay | Admitting: Urology

## 2023-09-06 VITALS — BP 147/94 | HR 69 | Ht 71.0 in | Wt 192.0 lb

## 2023-09-06 DIAGNOSIS — R972 Elevated prostate specific antigen [PSA]: Secondary | ICD-10-CM

## 2023-09-06 DIAGNOSIS — C61 Malignant neoplasm of prostate: Secondary | ICD-10-CM

## 2023-09-06 NOTE — Progress Notes (Signed)
I, Duke Salvia, acting as a Neurosurgeon for Riki Altes, MD., have documented all relevant documentation on the behalf of Riki Altes, MD, as directed by  Riki Altes, MD while in the presence of Riki Altes, MD.  09/06/2023 10:48 AM   Eddie Cabrera [birth year] [id number]  Referring provider: Malva Limes, MD [address] [place],  [place] [address]  Chief Complaint  Patient presents with   Follow-up    Urologic history:  1.  T1c low risk prostate cancer PSA 11/2020 -11.9; repeat PSA January and February 2022 were 6.9 and 6.4 respectively MRI PI-RADS 4 lesions x3 MR fusion biopsy 04/29/2021 with 56 g prostate 2/3 ROI biopsies with Gleason 3+3 adenocarcinoma; template biopsies benign Elected active surveillance Confirmatory biopsy 08/17/22 showed no cancer 12/12 cores.  2.  Gross hematuria Episode total gross painless hematuria November 2022 CTU 12/2021: 2.6 cm right renal cyst, punctate nonobstructing right lower pole calculus, BPH Cystoscopy 01/2022: Prominent lateral lobe enlargement with friable hypervascularity Urine cytology negative   HPI: 67 y.o. male presents for annual follow-up.  Doing well since last visit No bothersome LUTS Denies dysuria, gross hematuria Denies flank, abdominal or pelvic pain PSA 08/31/23 4.7   PMH: Past Medical History:  Diagnosis Date   Depression    History of chicken pox    Prostate CA 03/09/2021   Wears contact lenses     Surgical History: Past Surgical History:  Procedure Laterality Date   COLONOSCOPY WITH PROPOFOL N/A 03/23/2021   Procedure: COLONOSCOPY WITH PROPOFOL;  Surgeon: Wyline Mood, MD;  Location[facility] ENDOSCOPY;  Service: Gastroenterology;  Laterality: N/A;   RHINOPLASTY     AGE 16 and again at age 53- history of deviated Septum    Home Medications:  Allergies as of 09/06/2023   No Known Allergies      Medication List        Accurate as of September 06, 2023 10:48 AM. If you have  any questions, ask your nurse or doctor.          ALPRAZolam 1 MG tablet Commonly known as: XANAX Take 1/4 to 1/2 tablet by mouth daily as needed for anxiety or sleep.   sertraline 100 MG tablet Commonly known as: ZOLOFT Take 1-2 tablets (100-200 mg total) by mouth daily.        Allergies: No Known Allergies  Family History: Family History  Problem Relation Age of Onset   Bladder Cancer Mother    Kidney failure Father    Colon cancer Neg Hx    Prostate cancer Neg Hx     Social History:  reports that he has never smoked. He has never used smokeless tobacco. He reports current alcohol use of about 7.0 standard drinks of alcohol per week. He reports that he does not use drugs.   Physical Exam: BP (!) 147/94   Pulse 69   Ht 5\' 11"  (1.803 m)   Wt 192 lb (87.1 kg)   BMI 26.78 kg/m   Constitutional:  Alert and oriented, No acute distress. HEENT: [place] AT, moist mucus membranes.  Trachea midline, no masses. Cardiovascular: No clubbing, cyanosis, or edema. Respiratory: Normal respiratory effort, no increased work of breathing. GU: Prostate 50 g, smooth without nodules Psychiatric: Normal mood and affect.    Assessment & Plan:    1.  T1c low risk prostate cancer Stable PSA Desires to continue active surveillance 6 month lab visit for PSA and 1 year follow up with  PSA/DRE  I have reviewed the above documentation for accuracy and completeness, and I agree with the above.   Riki Altes, MD  [facility] [address], [address] [place], [place] [address] [phone]

## 2023-09-07 ENCOUNTER — Encounter: Payer: Self-pay | Admitting: Urology

## 2023-09-15 ENCOUNTER — Ambulatory Visit: Payer: 59 | Admitting: Family Medicine

## 2023-10-04 ENCOUNTER — Ambulatory Visit: Payer: 59 | Admitting: Family Medicine

## 2023-11-26 DIAGNOSIS — R69 Illness, unspecified: Secondary | ICD-10-CM | POA: Diagnosis not present

## 2024-02-21 ENCOUNTER — Encounter: Payer: Self-pay | Admitting: Family Medicine

## 2024-03-05 ENCOUNTER — Other Ambulatory Visit: Payer: 59

## 2024-03-05 DIAGNOSIS — C61 Malignant neoplasm of prostate: Secondary | ICD-10-CM | POA: Diagnosis not present

## 2024-03-06 ENCOUNTER — Encounter: Payer: Self-pay | Admitting: Urology

## 2024-03-06 LAB — PSA: Prostate Specific Ag, Serum: 5 ng/mL — ABNORMAL HIGH (ref 0.0–4.0)

## 2024-03-20 ENCOUNTER — Ambulatory Visit (INDEPENDENT_AMBULATORY_CARE_PROVIDER_SITE_OTHER): Payer: Self-pay | Admitting: Family Medicine

## 2024-03-20 VITALS — BP 120/80 | HR 76 | Ht 71.0 in | Wt 183.0 lb

## 2024-03-20 DIAGNOSIS — Z23 Encounter for immunization: Secondary | ICD-10-CM

## 2024-03-20 DIAGNOSIS — Z13228 Encounter for screening for other metabolic disorders: Secondary | ICD-10-CM

## 2024-03-20 DIAGNOSIS — Z0000 Encounter for general adult medical examination without abnormal findings: Secondary | ICD-10-CM | POA: Diagnosis not present

## 2024-03-20 DIAGNOSIS — G479 Sleep disorder, unspecified: Secondary | ICD-10-CM | POA: Diagnosis not present

## 2024-03-20 DIAGNOSIS — F419 Anxiety disorder, unspecified: Secondary | ICD-10-CM | POA: Diagnosis not present

## 2024-03-20 DIAGNOSIS — Z136 Encounter for screening for cardiovascular disorders: Secondary | ICD-10-CM

## 2024-03-20 MED ORDER — SERTRALINE HCL 100 MG PO TABS: 100.0000 mg | ORAL_TABLET | Freq: Every day | ORAL | 3 refills | Status: AC

## 2024-03-20 MED ORDER — ALPRAZOLAM 1 MG PO TABS: ORAL_TABLET | ORAL | 1 refills | Status: AC

## 2024-03-20 NOTE — Progress Notes (Signed)
 Complete physical exam   Patient: Eddie Cabrera   DOB: 1956-03-18   68 y.o. Male  MRN: [id number] Visit Date: 03/20/2024  Today's healthcare provider: Mila Merry, MD   Chief Complaint  Patient presents with   Annual Exam    Neck pain and stuffiness this have been going on for years now    Subjective    Eddie Cabrera is a 68 y.o. male who presents today for a complete physical exam.  He reports consuming a  healthy  diet.  He generally feels well. He reports sleeping well. He does not have additional problems to discuss today. He continues on sertraline for anxiety and reports it continues to work very well for him and wishes to continue same regiment. He rarely takes alprazolam, maybe every couple of months and usually just a quarter of a tablet.    Past Medical History:  Diagnosis Date   Depression    History of chicken pox    Prostate CA 03/09/2021   Wears contact lenses    Past Surgical History:  Procedure Laterality Date   COLONOSCOPY WITH PROPOFOL N/A 03/23/2021   Procedure: COLONOSCOPY WITH PROPOFOL;  Surgeon: Wyline Mood, MD;  Location: [facility] ENDOSCOPY;  Service: Gastroenterology;  Laterality: N/A;   RHINOPLASTY     AGE 24 and again at age 34- history of deviated Septum   Social History   Socioeconomic History   Marital status: Single    Spouse name: Not on file   Number of children: 1   Years of education: Not on file   Highest education level: Master's degree (e.g., MA, MS, MEng, MEd, MSW, MBA)  Occupational History   Not on file  Tobacco Use   Smoking status: Never   Smokeless tobacco: Never  Vaping Use   Vaping status: Never Used  Substance and Sexual Activity   Alcohol use: Yes    Alcohol/week: 7.0 standard drinks of alcohol    Types: 7 Cans of beer per week    Comment: moderate use   Drug use: No   Sexual activity: Not on file  Other Topics Concern   Not on file  Social History Narrative   Not on file   Social Drivers of Health    Financial Resource Strain: Low Risk  (03/19/2024)   Overall Financial Resource Strain (CARDIA)    Difficulty of Paying Living Expenses: Not hard at all  Food Insecurity: No Food Insecurity (03/19/2024)   Hunger Vital Sign    Worried About Running Out of Food in the Last Year: Never true    Ran Out of Food in the Last Year: Never true  Transportation Needs: No Transportation Needs (03/19/2024)   PRAPARE - Administrator, Civil Service (Medical): No    Lack of Transportation (Non-Medical): No  Physical Activity: Insufficiently Active (03/19/2024)   Exercise Vital Sign    Days of Exercise per Week: 2 days    Minutes of Exercise per Session: 30 min  Stress: No Stress Concern Present (03/19/2024)   [employer] of Occupational Health - Occupational Stress Questionnaire    Feeling of Stress : Only a little  Social Connections: Socially Isolated (03/19/2024)   Social Connection and Isolation Panel [NHANES]    Frequency of Communication with Friends and Family: Never    Frequency of Social Gatherings with Friends and Family: Never    Attends Religious Services: Never    Database administrator or Organizations: No    Attends  Club or Organization Meetings: Not on file    Marital Status: Divorced  Intimate Partner Violence: Not At Risk (03/20/2024)   Humiliation, Afraid, Rape, and Kick questionnaire    Fear of Current or Ex-Partner: No    Emotionally Abused: No    Physically Abused: No    Sexually Abused: No   Family Status  Relation Name Status   Mother  Deceased       died in her 60's   Father  Deceased at age 35       kidney failure   Sister  Alive   Neg Hx  (Not Specified)  No partnership data on file   Family History  Problem Relation Age of Onset   Bladder Cancer Mother    Kidney failure Father    Colon cancer Neg Hx    Prostate cancer Neg Hx    No Known Allergies  Patient Care Team: Malva Limes, MD as PCP - General (Family Medicine)    Medications: Outpatient Medications Prior to Visit  Medication Sig   [DISCONTINUED] ALPRAZolam (XANAX) 1 MG tablet Take 1/4 to 1/2 tablet by mouth daily as needed for anxiety or sleep.   [DISCONTINUED] sertraline (ZOLOFT) 100 MG tablet Take 1-2 tablets (100-200 mg total) by mouth daily.   No facility-administered medications prior to visit.    Review of Systems  Constitutional:  Negative for appetite change, chills and fever.  Respiratory:  Negative for chest tightness, shortness of breath and wheezing.   Cardiovascular:  Negative for chest pain and palpitations.  Gastrointestinal:  Negative for abdominal pain, nausea and vomiting.      Objective    BP 120/80   Pulse 76   Ht 5\' 11"  (1.803 m)   Wt 183 lb (83 kg)   SpO2 97%   BMI 25.52 kg/m    Physical Exam   General Appearance:    Well developed, well nourished male. Alert, cooperative, in no acute distress, appears stated age  Head:    Normocephalic, without obvious abnormality, atraumatic  Eyes:    PERRL, conjunctiva/corneas clear, EOM's intact, fundi    benign, both eyes       Ears:    Normal TM's and external ear canals, both ears  Nose:   Nares normal, septum midline, mucosa normal, no drainage   or sinus tenderness  Throat:   Lips, mucosa, and tongue normal; teeth and gums normal  Neck:   Supple, symmetrical, trachea midline, no adenopathy;       thyroid:  No enlargement/tenderness/nodules; no carotid   bruit or JVD  Back:     Symmetric, no curvature, ROM normal, no CVA tenderness  Lungs:     Clear to auscultation bilaterally, respirations unlabored  Chest wall:    No tenderness or deformity  Heart:    Normal heart rate. Normal rhythm. No murmurs, rubs, or gallops.  S1 and S2 normal  Abdomen:     Soft, non-tender, bowel sounds active all four quadrants,    no masses, no organomegaly  Genitalia:    deferred  Rectal:    deferred  Extremities:   All extremities are intact. No cyanosis or edema  Pulses:   2+ and  symmetric all extremities  Skin:   Skin color, texture, turgor normal, no rashes or lesions  Lymph nodes:   Cervical, supraclavicular, and axillary nodes normal  Neurologic:   CNII-XII intact. Normal strength, sensation and reflexes      throughout     Last depression screening  scores    03/20/2024    8:55 AM 03/24/2023    4:32 PM 03/22/2022    3:22 PM  PHQ 2/9 Scores  PHQ - 2 Score 0 0 0  PHQ- 9 Score 3 3 0   Last fall risk screening    03/24/2023    4:32 PM  Fall Risk   Falls in the past year? 0  Number falls in past yr: 0  Injury with Fall? 0  Risk for fall due to : No Fall Risks  Follow up Falls evaluation completed   Last Audit-C alcohol use screening    03/19/2024   11:10 AM  Alcohol Use Disorder Test (AUDIT)  1. How often do you have a drink containing alcohol? 3  2. How many drinks containing alcohol do you have on a typical day when you are drinking? 0  3. How often do you have six or more drinks on one occasion? 0  AUDIT-C Score 3   4. How often during the last year have you found that you were not able to stop drinking once you had started? 0  5. How often during the last year have you failed to do what was normally expected from you because of drinking? 0  6. How often during the last year have you needed a first drink in the morning to get yourself going after a heavy drinking session? 0  7. How often during the last year have you had a feeling of guilt of remorse after drinking? 0  8. How often during the last year have you been unable to remember what happened the night before because you had been drinking? 0  9. Have you or someone else been injured as a result of your drinking? 0  10. Has a relative or friend or a doctor or another health worker been concerned about your drinking or suggested you cut down? 0  Alcohol Use Disorder Identification Test Final Score (AUDIT) 3      Patient-reported   A score of 3 or more in women, and 4 or more in men indicates  increased risk for alcohol abuse, EXCEPT if all of the points are from question 1   No results found for any visits on 03/20/24.  Assessment & Plan    Routine Health Maintenance and Physical Exam  Exercise Activities and Dietary recommendations  Goals   None     Immunization History  Administered Date(s) Administered   Influenza,inj,Quad PF,6+ Mos 10/26/2015, 09/14/2016, 11/23/2017, 11/29/2018, 11/17/2020   PFIZER(Purple Top)SARS-COV-2 Vaccination 03/26/2020, 04/20/2020, 11/17/2020   PNEUMOCOCCAL CONJUGATE-20 03/20/2024   Pfizer Covid-19 Vaccine Bivalent Booster 48yrs & up 09/02/2021   Td 11/29/2018   Tdap 05/28/2008   Zoster Recombinant(Shingrix) 12/12/2017, 02/13/2018    Health Maintenance  Topic Date Due   Medicare Annual Wellness (AWV)  Never done   HIV Screening  Never done   INFLUENZA VACCINE  07/27/2023   COVID-19 Vaccine (5 - 2024-25 season) 08/27/2023   DTaP/Tdap/Td (3 - Td or Tdap) 11/29/2028   Colonoscopy  03/24/2031   Pneumonia Vaccine 55+ Years old  Completed   Hepatitis C Screening  Completed   Zoster Vaccines- Shingrix  Completed   HPV VACCINES  Aged Out   Fecal DNA (Cologuard)  Discontinued    Discussed health benefits of physical activity, and encouraged him to engage in regular exercise appropriate for his age and condition.   2. Encounter for special screening examination for cardiovascular disorder -Lipid panel  3. Need for pneumococcal  vaccination  - Pneumococcal conjugate vaccine 20-valent (Prevnar 20)  4. Screening for metabolic disorder  - Lipid panel - Basic metabolic panel  5. Anxiety Doing very well with current dose of sertraline. Refilled today.  - sertraline (ZOLOFT) 100 MG tablet; Take 1-2 tablets (100-200 mg total) by mouth daily.  Dispense: 180 tablet; Refill: 3  6. Sleep disturbance Does very well with occasionally  ALPRAZolam (XANAX) 1 MG tablet; Take 1/4 to 1/2 tablet by mouth daily as needed for anxiety or sleep.   Dispense: 30 tablet; Refill: 1          Mila Merry, MD  [facility] [phone] (phone) [phone] (fax)  [facility]

## 2024-09-02 ENCOUNTER — Other Ambulatory Visit: Payer: Self-pay

## 2024-09-03 ENCOUNTER — Other Ambulatory Visit

## 2024-09-03 DIAGNOSIS — C61 Malignant neoplasm of prostate: Secondary | ICD-10-CM | POA: Diagnosis not present

## 2024-09-04 ENCOUNTER — Encounter: Payer: Self-pay | Admitting: Urology

## 2024-09-04 ENCOUNTER — Ambulatory Visit: Payer: Self-pay | Admitting: Urology

## 2024-09-04 VITALS — BP 134/94 | HR 64 | Ht 71.0 in | Wt 185.0 lb

## 2024-09-04 DIAGNOSIS — C61 Malignant neoplasm of prostate: Secondary | ICD-10-CM | POA: Diagnosis not present

## 2024-09-04 LAB — PSA: Prostate Specific Ag, Serum: 5.1 ng/mL — ABNORMAL HIGH (ref 0.0–4.0)

## 2024-09-04 NOTE — Progress Notes (Signed)
   09/04/2024 9:26 AM   [name] 1956/04/07 [id number]  Referring provider: Gasper Nancyann BRAVO, MD [address] [place],  [place] [address]  Chief Complaint  Patient presents with   Follow-up   Urologic history:   1.  T1c low risk prostate cancer PSA 11/2020 -11.9; repeat PSA January and February 2022 were 6.9 and 6.4 respectively MRI PI-RADS 4 lesions x3 MR fusion biopsy 04/29/2021 with 56 g prostate 2/3 ROI biopsies with Gleason 3+3 adenocarcinoma; template biopsies benign Elected active surveillance Confirmatory biopsy 08/17/22 showed no cancer 12/12 cores.  2.  Gross hematuria Episode total gross painless hematuria November 2022 CTU 12/2021: 2.6 cm right renal cyst, punctate nonobstructing right lower pole calculus, BPH Cystoscopy 01/2022: Prominent lateral lobe enlargement with friable hypervascularity Urine cytology negative   HPI: Eddie Cabrera is a 68 y.o. male who presents for low risk prostate cancer follow-up.  Doing well since last visit No bothersome LUTS Denies dysuria, gross hematuria Denies flank, abdominal or pelvic pain PSA levels since last visit 5.0 on 03/05/2024 and 5.1 on 09/03/2024   PMH: Past Medical History:  Diagnosis Date   Depression    History of chicken pox    Prostate CA 03/09/2021   Wears contact lenses     Surgical History: Past Surgical History:  Procedure Laterality Date   COLONOSCOPY WITH PROPOFOL  N/A 03/23/2021   Procedure: COLONOSCOPY WITH PROPOFOL ;  Surgeon: Therisa Bi, MD;  Location: [facility] ENDOSCOPY;  Service: Gastroenterology;  Laterality: N/A;   RHINOPLASTY     AGE 36 and again at age 41- history of deviated Septum    Home Medications:  Allergies as of 09/04/2024   No Known Allergies      Medication List        Accurate as of September 04, 2024  9:26 AM. If you have any questions, ask your nurse or doctor.          ALPRAZolam  1 MG tablet Commonly known as: XANAX  Take 1/4 to 1/2 tablet by mouth  daily as needed for anxiety or sleep.   sertraline  100 MG tablet Commonly known as: ZOLOFT  Take 1-2 tablets (100-200 mg total) by mouth daily.        Allergies: No Known Allergies  Family History: Family History  Problem Relation Age of Onset   Bladder Cancer Mother    Kidney failure Father    Colon cancer Neg Hx    Prostate cancer Neg Hx     Social History:  reports that he has never smoked. He has never used smokeless tobacco. He reports current alcohol use of about 7.0 standard drinks of alcohol per week. He reports that he does not use drugs.   Physical Exam: BP (!) 134/94   Pulse 64   Ht 5' 11 (1.803 m)   Wt 185 lb (83.9 kg)   BMI 25.80 kg/m   Constitutional:  Alert, No acute distress. HEENT: [place] AT Respiratory: Normal respiratory effort, no increased work of breathing. GU: Prostate 50 g, smooth without nodules Psychiatric: Normal mood and affect.   Assessment & Plan:    1.  T1c low risk prostate cancer Stable PSA Benign DRE Continue active surveillance Lab visit for PSA 6 months and 1 year follow-up office visit with PSA/DRE   Glendia JAYSON Barba, MD  [facility] [address], [address] [place], [place] [address] [phone]

## 2024-10-29 DIAGNOSIS — H52223 Regular astigmatism, bilateral: Secondary | ICD-10-CM | POA: Diagnosis not present

## 2024-10-29 DIAGNOSIS — H5202 Hypermetropia, left eye: Secondary | ICD-10-CM | POA: Diagnosis not present

## 2025-03-04 ENCOUNTER — Other Ambulatory Visit

## 2025-03-21 ENCOUNTER — Encounter: Admitting: Family Medicine

## 2025-08-29 ENCOUNTER — Other Ambulatory Visit

## 2025-09-05 ENCOUNTER — Ambulatory Visit: Admitting: Urology
